# Patient Record
Sex: Male | Born: 1960 | Race: White | Hispanic: No | Smoking: Never smoker
Health system: Southern US, Community
[De-identification: ages and names within clinical notes are randomized; demographics above are authoritative.]

## PROBLEM LIST (undated history)

## (undated) DIAGNOSIS — K219 Gastro-esophageal reflux disease without esophagitis: Secondary | ICD-10-CM

## (undated) DIAGNOSIS — N400 Enlarged prostate without lower urinary tract symptoms: Secondary | ICD-10-CM

## (undated) DIAGNOSIS — R3915 Urgency of urination: Secondary | ICD-10-CM

## (undated) DIAGNOSIS — R0683 Snoring: Secondary | ICD-10-CM

## (undated) DIAGNOSIS — F8081 Childhood onset fluency disorder: Secondary | ICD-10-CM

## (undated) DIAGNOSIS — F419 Anxiety disorder, unspecified: Secondary | ICD-10-CM

## (undated) HISTORY — DX: Urgency of urination: R39.15

## (undated) HISTORY — DX: Snoring: R06.83

## (undated) HISTORY — DX: Childhood onset fluency disorder: F80.81

## (undated) HISTORY — DX: Anxiety disorder, unspecified: F41.9

## (undated) HISTORY — DX: Benign prostatic hyperplasia without lower urinary tract symptoms: N40.0

---

## 1998-04-26 ENCOUNTER — Inpatient Hospital Stay (HOSPITAL_COMMUNITY): Admission: EM | Admit: 1998-04-26 | Discharge: 1998-04-29 | Payer: Self-pay | Admitting: Emergency Medicine

## 2005-06-25 ENCOUNTER — Ambulatory Visit (HOSPITAL_BASED_OUTPATIENT_CLINIC_OR_DEPARTMENT_OTHER): Admission: RE | Admit: 2005-06-25 | Discharge: 2005-06-25 | Payer: Self-pay | Admitting: Otolaryngology

## 2005-07-11 ENCOUNTER — Ambulatory Visit: Payer: Self-pay | Admitting: Internal Medicine

## 2005-09-24 ENCOUNTER — Ambulatory Visit (HOSPITAL_BASED_OUTPATIENT_CLINIC_OR_DEPARTMENT_OTHER): Admission: RE | Admit: 2005-09-24 | Discharge: 2005-09-24 | Payer: Self-pay | Admitting: Otolaryngology

## 2008-09-13 HISTORY — PX: NASAL SEPTUM SURGERY: SHX37

## 2011-07-14 ENCOUNTER — Encounter: Payer: Self-pay | Admitting: Internal Medicine

## 2011-07-14 ENCOUNTER — Ambulatory Visit (INDEPENDENT_AMBULATORY_CARE_PROVIDER_SITE_OTHER): Payer: 59 | Admitting: Internal Medicine

## 2011-07-14 VITALS — BP 116/80 | HR 75 | Temp 98.2°F | Ht 71.0 in | Wt 190.0 lb

## 2011-07-14 DIAGNOSIS — R0609 Other forms of dyspnea: Secondary | ICD-10-CM

## 2011-07-14 DIAGNOSIS — F411 Generalized anxiety disorder: Secondary | ICD-10-CM

## 2011-07-14 DIAGNOSIS — G43909 Migraine, unspecified, not intractable, without status migrainosus: Secondary | ICD-10-CM | POA: Insufficient documentation

## 2011-07-14 DIAGNOSIS — F419 Anxiety disorder, unspecified: Secondary | ICD-10-CM

## 2011-07-14 DIAGNOSIS — N400 Enlarged prostate without lower urinary tract symptoms: Secondary | ICD-10-CM

## 2011-07-14 DIAGNOSIS — R0683 Snoring: Secondary | ICD-10-CM | POA: Insufficient documentation

## 2011-07-14 LAB — POCT URINALYSIS DIPSTICK
Ketones, UA: NEGATIVE
Protein, UA: NEGATIVE

## 2011-07-14 LAB — PSA: PSA: 1.81 ng/mL (ref 0.10–4.00)

## 2011-07-14 MED ORDER — SERTRALINE HCL 50 MG PO TABS: 75.0000 mg | ORAL_TABLET | Freq: Every day | ORAL | Status: DC

## 2011-07-14 NOTE — Assessment & Plan Note (Addendum)
s/p 3 sleep study: all neg , last 2011 Nasal surgery did not help Has tried a variety of nasal sprays w/o much help Will let me know if interested in another opinion

## 2011-07-14 NOTE — Patient Instructions (Signed)
Get records  from previous doctor

## 2011-07-14 NOTE — Assessment & Plan Note (Signed)
Sx not as well controlled as he desires, has not try other meds or higher dose of sertraline Plan: increase frm 50 to 75 mg (asked for the "lowest next dose")

## 2011-07-14 NOTE — Assessment & Plan Note (Addendum)
Sx suggest BPH although he has never been dx w/ that condition Plan: PSA UA w/ some evidence of infex, UCX pending  Will wait for result, consider Abx and/or flomax

## 2011-07-14 NOTE — Progress Notes (Signed)
  Subjective:    Patient ID: Daniel Petersen, male    DOB: [birth year], 50 y.o.   MRN: [id number]  HPI New patient, previous PCP Dr Moshe Salisbury Snoring: had nasal surgery for correction of snoring, still snores  Anxiety: on zoloft, sx not as well controlled as he desires Also 1 year h/o nocturia (x 2 at night), urinary frequency; no h/o prostate problems   Past Medical History  Diagnosis Date  . Migraine     f/u by neuro  . Snoring     s/p 3 sleep study: all neg , last 2011  . Anxiety     f/u by PCP   [phone] (home) [phone] (work) History   Social History  . Marital Status: Legally Separated    Spouse Name: N/A    Number of Children: 2  . Years of Education: N/A   Occupational History  . Curator    Social History Main Topics  . Smoking status: Never Smoker   . Smokeless tobacco: Never Used  . Alcohol Use: No  . Drug Use: No  . Sexually Active: Not on file   Other Topics Concern  . Not on file   Social History Narrative   Lives with wife    Family History  Problem Relation Age of Onset  . Stroke Mother     M late 6s  . Diabetes Maternal Grandmother   . Colon cancer Neg Hx   . Prostate cancer Neg Hx      Review of Systems No F-C No N-V No gross hematuria, no dysuria, no penile d/c  No depression per se     Objective:   Physical Exam  Constitutional: He is oriented to person, place, and time. He appears well-developed and well-nourished. No distress.  HENT:  Head: Normocephalic and atraumatic.       slt congested but on inspection turbinates seem normal  Cardiovascular: Normal rate, regular rhythm and normal heart sounds.   No murmur heard. Pulmonary/Chest: Effort normal and breath sounds normal. No respiratory distress. He has no wheezes. He has no rales.  Abdominal: Soft. Bowel sounds are normal. He exhibits no distension. There is no tenderness. There is no rebound and no guarding.  Genitourinary:       Anus: no external lesions Rectum  normal Prostate slt enlarge, no nodular or tender   Musculoskeletal: He exhibits no edema.  Neurological: He is alert and oriented to person, place, and time.  Skin: He is not diaphoretic.          Assessment & Plan:

## 2011-07-21 ENCOUNTER — Telehealth: Payer: Self-pay

## 2011-07-21 MED ORDER — TAMSULOSIN HCL 0.4 MG PO CAPS: 0.4000 mg | ORAL_CAPSULE | ORAL | Status: DC

## 2011-07-21 NOTE — Telephone Encounter (Signed)
Pt aware of results and requested that Rx be sent to [facility] [address].  Rx sent

## 2011-07-21 NOTE — Telephone Encounter (Signed)
Message copied by Beverely Low on Wed Jul 21, 2011 11:02 AM ------      Message from: Daniel Petersen      Created: Sun Jul 18, 2011 10:41 AM       Advise patient:      Labs normal      I recommend: flomax 0.4 mg 1 tab at night until he cames back, call #[phone], 6 Rf

## 2011-11-11 ENCOUNTER — Encounter: Payer: Self-pay | Admitting: Internal Medicine

## 2011-11-11 ENCOUNTER — Ambulatory Visit (INDEPENDENT_AMBULATORY_CARE_PROVIDER_SITE_OTHER): Payer: 59 | Admitting: Internal Medicine

## 2011-11-11 VITALS — BP 110/72 | HR 84 | Temp 98.0°F | Wt 189.0 lb

## 2011-11-11 DIAGNOSIS — F419 Anxiety disorder, unspecified: Secondary | ICD-10-CM

## 2011-11-11 DIAGNOSIS — F411 Generalized anxiety disorder: Secondary | ICD-10-CM

## 2011-11-11 DIAGNOSIS — N400 Enlarged prostate without lower urinary tract symptoms: Secondary | ICD-10-CM

## 2011-11-11 DIAGNOSIS — G43909 Migraine, unspecified, not intractable, without status migrainosus: Secondary | ICD-10-CM

## 2011-11-11 LAB — POCT URINALYSIS DIPSTICK
Bilirubin, UA: NEGATIVE
Glucose, UA: NEGATIVE
Ketones, UA: NEGATIVE
Leukocytes, UA: NEGATIVE
Nitrite, UA: NEGATIVE
pH, UA: 5

## 2011-11-11 NOTE — Assessment & Plan Note (Signed)
PSA was wnl, better on tamsulosin

## 2011-11-11 NOTE — Assessment & Plan Note (Signed)
Better since SSRIs increased, no change

## 2011-11-11 NOTE — Assessment & Plan Note (Signed)
Well controlled with current medications.

## 2011-11-11 NOTE — Progress Notes (Signed)
  Subjective:    Patient ID: Daniel Petersen, male    DOB: May 30, 1961, 51 y.o.   MRN: [id number]  HPI Routine office visit Anxiety, we recently increased his SSRI, doing much better, no apparent side effects. BPH, complained of nocturia 2 months ago, urine culture negative, PSA normal, started Flomax---> nocturia has decreased from 2-3 a night to at most one every night. Request a DOT  Exam  Past medical history Migraines, followup by neurology Snoring, status post 3 sleep studies, all negative, last 2000 and Anxiety, followed by PCP BPH, diagnosed around 06-2011  Past surgical history Nasal septum surgery 2000 and   Review of Systems No dysuria, gross hematuria. No difficulty urinating. Migraines well controlled.     Objective:   Physical Exam  Constitutional: He is oriented to person, place, and time. He appears well-developed and well-nourished. No distress.  HENT:  Head: Normocephalic and atraumatic.  Right Ear: External ear normal.  Left Ear: External ear normal.  Neck: Normal range of motion.  Cardiovascular: Normal rate, regular rhythm and normal heart sounds.   No murmur heard. Pulmonary/Chest: Effort normal and breath sounds normal. No respiratory distress. He has no wheezes. He has no rales.  Abdominal: Soft. Bowel sounds are normal. There is no tenderness. There is no rebound and no guarding.       No inguinal hernias  Musculoskeletal:       Gait normal  Neurological: He is alert and oriented to person, place, and time.       Motor and DTRs symmetric, normal  Skin: Skin is warm and dry. He is not diaphoretic.  Psychiatric: He has a normal mood and affect. His behavior is normal. Judgment and thought content normal.          Assessment & Plan:  DOT exam completed, forms filled  Today , In addition of his regular visit I spent additional time feeling forms. F2F > 25 min

## 2011-12-25 ENCOUNTER — Encounter (HOSPITAL_BASED_OUTPATIENT_CLINIC_OR_DEPARTMENT_OTHER): Payer: Self-pay | Admitting: *Deleted

## 2011-12-25 ENCOUNTER — Emergency Department (HOSPITAL_BASED_OUTPATIENT_CLINIC_OR_DEPARTMENT_OTHER)
Admission: EM | Admit: 2011-12-25 | Discharge: 2011-12-25 | Disposition: A | Discharge status: Home / Self Care | Payer: 59 | Attending: Emergency Medicine | Admitting: Emergency Medicine

## 2011-12-25 DIAGNOSIS — T25222A Burn of second degree of left foot, initial encounter: Secondary | ICD-10-CM

## 2011-12-25 DIAGNOSIS — F411 Generalized anxiety disorder: Secondary | ICD-10-CM | POA: Insufficient documentation

## 2011-12-25 DIAGNOSIS — Z79899 Other long term (current) drug therapy: Secondary | ICD-10-CM | POA: Insufficient documentation

## 2011-12-25 DIAGNOSIS — T25229A Burn of second degree of unspecified foot, initial encounter: Secondary | ICD-10-CM | POA: Insufficient documentation

## 2011-12-25 DIAGNOSIS — W3189XA Contact with other specified machinery, initial encounter: Secondary | ICD-10-CM | POA: Insufficient documentation

## 2011-12-25 MED ORDER — HYDROCODONE-ACETAMINOPHEN 5-325 MG PO TABS: 2.0000 | ORAL_TABLET | ORAL | Status: AC | PRN

## 2011-12-25 NOTE — ED Notes (Signed)
Pt states he was using a pressure washer about a week ago and hot water got down into his boot and burned his left foot. Has been using neosporin, but wound is not healing.

## 2011-12-25 NOTE — Discharge Instructions (Signed)
Continue your present treatment with dressings and Neosporin.  Return if no improvement or development of fever or red streaks.   Burn Care Your skin is a natural barrier to infection. It is the largest organ of your body. Burns damage this natural protection. To help prevent infection, it is very important to follow your caregiver's instructions in the care of your burn. Burns are classified as:  First degree. There is only redness of the skin (erythema). No scarring is expected.   Second degree. There is blistering of the skin. Scarring may occur with deeper burns.   Third degree. All layers of the skin are injured, and scarring is expected.  HOME CARE INSTRUCTIONS   Wash your hands well before changing your bandage.   Change your bandage as often as directed by your caregiver.   Remove the old bandage. If the bandage sticks, you may soak it off with cool, clean water.   Cleanse the burn thoroughly but gently with mild soap and water.   Pat the area dry with a clean, dry cloth.   Apply a thin layer of antibacterial cream to the burn.   Apply a clean bandage as instructed by your caregiver.   Keep the bandage as clean and dry as possible.   Elevate the affected area for the first 24 hours, then as instructed by your caregiver.   Only take over-the-counter or prescription medicines for pain, discomfort, or fever as directed by your caregiver.  SEEK IMMEDIATE MEDICAL CARE IF:   You develop excessive pain.   You develop redness, tenderness, swelling, or red streaks near the burn.   The burned area develops yellowish-white fluid (pus) or a bad smell.   You have a fever.  MAKE SURE YOU:   Understand these instructions.   Will watch your condition.   Will get help right away if you are not doing well or get worse.  Document Released: 08/30/2005 Document Revised: 08/19/2011 Document Reviewed: 01/20/2011 [facility] Patient Information 2012 [place], [place].

## 2011-12-25 NOTE — ED Provider Notes (Signed)
History   This chart was scribed for Nelia Shi, MD by Charolett Bumpers . The patient was seen in room MH07/MH07 and the patient's care was started at 3:21pm.    CSN: [id number]  Arrival date & time 12/25/11  1439   First MD Initiated Contact with Patient 12/25/11 1521      Chief Complaint  Patient presents with  . Foot Burn    (Consider location/radiation/quality/duration/timing/severity/associated sxs/prior treatment) HPI Daniel Petersen is a 51 y.o. male who presents to the Emergency Department complaining of a constant, moderate 2nd degree burn to the top of his left foot. Patient states that about a week ago, he was using a pressure washer when the hot water went down his boot and burned his left foot. Patient states that for the past week, he has been using neosporin on the burn as well as keeping it wrapped with gauze. Patient states that he has been wearing a shoe on the injured foot, which has provided some irritation. No other symptoms reported. No pertinent medical problems noted.     Past Medical History  Diagnosis Date  . Migraine     f/u by neuro  . Snoring     s/p 3 sleep study: all neg , last 2011  . Anxiety     f/u by PCP    Past Surgical History  Procedure Date  . Nasal septum surgery 2010    Family History  Problem Relation Age of Onset  . Stroke Mother     M late 18s  . Diabetes Maternal Grandmother   . Colon cancer Neg Hx   . Prostate cancer Neg Hx     History  Substance Use Topics  . Smoking status: Never Smoker   . Smokeless tobacco: Never Used  . Alcohol Use: No      Review of Systems A complete 10 system review of systems was obtained and all systems are negative except as noted in the HPI and PMH.   Allergies  Review of patient's allergies indicates no known allergies.  Home Medications   Current Outpatient Rx  Name Route Sig Dispense Refill  . ATENOLOL 50 MG PO TABS Oral Take 50 mg by mouth at bedtime.    Marland Kitchen  DICLOFENAC POTASSIUM 50 MG PO PACK Oral Take 50 mg by mouth as needed.      . SERTRALINE HCL 50 MG PO TABS Oral Take 1.5 tablets (75 mg total) by mouth daily. 60 tablet 2  . TAMSULOSIN HCL 0.4 MG PO CAPS Oral Take 1 capsule (0.4 mg total) by mouth daily after supper. 30 capsule 6  . ZOLMITRIPTAN 5 MG PO TABS Oral Take 5 mg by mouth as needed.        BP 95/66  Pulse 65  Temp(Src) 97.5 F (36.4 C) (Oral)  Resp 18  Ht 5\' 11"  (1.803 m)  Wt 189 lb (85.73 kg)  BMI 26.36 kg/m2  SpO2 100%  Physical Exam  Nursing note and vitals reviewed. Constitutional: He is oriented to person, place, and time. He appears well-developed and well-nourished. No distress.  HENT:  Head: Normocephalic and atraumatic.  Right Ear: External ear normal.  Left Ear: External ear normal.  Nose: Nose normal.  Eyes: Conjunctivae and EOM are normal. Pupils are equal, round, and reactive to light.  Neck: Normal range of motion. Neck supple. No tracheal deviation present.  Cardiovascular: Normal rate.   Pulmonary/Chest: Effort normal. No respiratory distress.  Abdominal: Soft. He exhibits no distension.  Musculoskeletal: Normal range of motion. He exhibits no edema.       Feet:  Neurological: He is alert and oriented to person, place, and time. No sensory deficit.  Skin: Skin is warm and dry.       3x4 cm 2nd degree burn on dorsal aspect of left foot. 2nd degree non-intact blister noted. Pink, granule skin underneath blister. No signs of cellulitis.   Psychiatric: He has a normal mood and affect. His behavior is normal.    ED Course  Procedures (including critical care time)  DIAGNOSTIC STUDIES: Oxygen Saturation is 100% on room air, normal by my interpretation.    COORDINATION OF CARE:   1524: Discussed planned course of care with the patient who is agreeable at this time. Will prescribe medication for pain. Discussed continuing the neosporin and returning if no improvement.   Labs Reviewed - No data to  display No results found.   No diagnosis found.    MDM    I personally performed the services described in this documentation, which was scribed in my presence. The recorded information has been reviewed and considered.         Nelia Shi, MD 12/25/11 1640

## 2012-01-29 ENCOUNTER — Other Ambulatory Visit: Payer: Self-pay | Admitting: Internal Medicine

## 2012-01-31 NOTE — Telephone Encounter (Signed)
Refill done.  

## 2012-03-08 ENCOUNTER — Encounter: Payer: Self-pay | Admitting: Family Medicine

## 2012-03-08 ENCOUNTER — Ambulatory Visit (INDEPENDENT_AMBULATORY_CARE_PROVIDER_SITE_OTHER): Payer: 59 | Admitting: Family Medicine

## 2012-03-08 ENCOUNTER — Telehealth: Payer: Self-pay

## 2012-03-08 VITALS — BP 115/74 | HR 70 | Temp 98.3°F | Resp 20 | Ht 70.5 in | Wt 189.2 lb

## 2012-03-08 DIAGNOSIS — R52 Pain, unspecified: Secondary | ICD-10-CM

## 2012-03-08 DIAGNOSIS — R1013 Epigastric pain: Secondary | ICD-10-CM

## 2012-03-08 LAB — POCT CBC
Granulocyte percent: 68.5 %G (ref 37–80)
HCT, POC: 42.3 % — AB (ref 43.5–53.7)
Hemoglobin: 13.9 g/dL — AB (ref 14.1–18.1)
Lymph, poc: 2.7 (ref 0.6–3.4)
MCH, POC: 29.8 pg (ref 27–31.2)
MCHC: 32.9 g/dL (ref 31.8–35.4)
MCV: 90.6 fL (ref 80–97)
MID (cbc): 0.7 (ref 0–0.9)
MPV: 8.4 fL (ref 0–99.8)
POC Granulocyte: 7.3 — AB (ref 2–6.9)
POC LYMPH PERCENT: 25 %L (ref 10–50)
POC MID %: 6.5 %M (ref 0–12)
Platelet Count, POC: 263 10*3/uL (ref 142–424)
RBC: 4.67 M/uL — AB (ref 4.69–6.13)
RDW, POC: 13.9 %
WBC: 10.6 10*3/uL — AB (ref 4.6–10.2)

## 2012-03-08 MED ORDER — ESOMEPRAZOLE MAGNESIUM 40 MG PO CPDR: 40.0000 mg | DELAYED_RELEASE_CAPSULE | Freq: Every day | ORAL | Status: DC

## 2012-03-08 MED ORDER — OMEPRAZOLE 40 MG PO CPDR: 40.0000 mg | DELAYED_RELEASE_CAPSULE | Freq: Every day | ORAL | Status: DC

## 2012-03-08 NOTE — Telephone Encounter (Signed)
[facility] called and stated that the Nexium Rxd by Dr L today is not covered by pt's ins and wondered if we can change him to something cheaper. Pharmacist checked w/pt who reported he has not tried omeprazole before. Dr L, OK'd change to omeprazole 40 QD. Called pharmacy and gave change to pharmacist.

## 2012-03-08 NOTE — Progress Notes (Signed)
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  No prior hx.  It appeared to be disappearing, but returned this am.   Tried cocacola and OTC "acid reducers" which haven't helped. No vomiting, diarrhea, or fever.  No urinary symptoms.  O:  NAD Chest:  CTA Heart: reg, ;no murmur Abdomen:  Soft, no HSM or masses, tender epigastrium without rebound or guarding Ext:  No abn Skin:  No rash  Results for orders placed in visit on 03/08/12  POCT CBC      Component Value Range   WBC 10.6 (*) 4.6 - 10.2 K/uL   Lymph, poc 2.7  0.6 - 3.4   POC LYMPH PERCENT 25.0  10 - 50 %L   MID (cbc) 0.7  0 - 0.9   POC MID % 6.5  0 - 12 %M   POC Granulocyte 7.3 (*) 2 - 6.9   Granulocyte percent 68.5  37 - 80 %G   RBC 4.67 (*) 4.69 - 6.13 M/uL   Hemoglobin 13.9 (*) 14.1 - 18.1 g/dL   HCT, POC 11.9 (*) 14.7 - 53.7 %   MCV 90.6  80 - 97 fL   MCH, POC 29.8  27 - 31.2 pg   MCHC 32.9  31.8 - 35.4 g/dL   RDW, POC 82.9     Platelet Count, POC 263  142 - 424 K/uL   MPV 8.4  0 - 99.8 fL   Assessment: Acute gastritis is the most likely cause.  Plan: Check H. pylori, start Nexium 3 mg

## 2012-03-09 NOTE — Progress Notes (Signed)
Completed prior auth for pt's omeprazole 40 and received approval for 36 mos. Faxed notice back to pharmacy.

## 2012-03-10 ENCOUNTER — Encounter: Payer: 59 | Admitting: Internal Medicine

## 2012-03-10 LAB — HELICOBACTER PYLORI  ANTIBODY, IGM: Helicobacter pylori, IgM: 4.6 U/mL (ref <9.0)

## 2012-05-16 ENCOUNTER — Encounter: Payer: 59 | Admitting: Internal Medicine

## 2012-07-17 ENCOUNTER — Ambulatory Visit (INDEPENDENT_AMBULATORY_CARE_PROVIDER_SITE_OTHER): Payer: 59 | Admitting: Internal Medicine

## 2012-07-17 ENCOUNTER — Encounter: Payer: Self-pay | Admitting: Internal Medicine

## 2012-07-17 VITALS — BP 130/86 | HR 75 | Temp 98.3°F | Ht 71.25 in | Wt 190.0 lb

## 2012-07-17 DIAGNOSIS — R1013 Epigastric pain: Secondary | ICD-10-CM

## 2012-07-17 DIAGNOSIS — R52 Pain, unspecified: Secondary | ICD-10-CM

## 2012-07-17 DIAGNOSIS — Z0000 Encounter for general adult medical examination without abnormal findings: Secondary | ICD-10-CM

## 2012-07-17 DIAGNOSIS — G43909 Migraine, unspecified, not intractable, without status migrainosus: Secondary | ICD-10-CM

## 2012-07-17 LAB — COMPREHENSIVE METABOLIC PANEL
ALT: 32 U/L (ref 0–53)
CO2: 27 mEq/L (ref 19–32)
Calcium: 9 mg/dL (ref 8.4–10.5)
Chloride: 103 mEq/L (ref 96–112)
Creatinine, Ser: 0.9 mg/dL (ref 0.4–1.5)
GFR: 93.4 mL/min (ref >60.00)
Glucose, Bld: 82 mg/dL (ref 70–99)
Sodium: 137 mEq/L (ref 135–145)
Total Bilirubin: 0.4 mg/dL (ref 0.3–1.2)
Total Protein: 7 g/dL (ref 6.0–8.3)

## 2012-07-17 LAB — CBC WITH DIFFERENTIAL/PLATELET
Basophils Absolute: 0.1 10*3/uL (ref 0.0–0.1)
Eosinophils Relative: 2.3 % (ref 0.0–5.0)
Lymphocytes Relative: 30.4 % (ref 12.0–46.0)
Monocytes Relative: 6.4 % (ref 3.0–12.0)
Neutrophils Relative %: 60.2 % (ref 43.0–77.0)
Platelets: 256 10*3/uL (ref 150.0–400.0)
RDW: 13.4 % (ref 11.5–14.6)
WBC: 7.4 10*3/uL (ref 4.5–10.5)

## 2012-07-17 LAB — PSA: PSA: 2.64 ng/mL (ref 0.10–4.00)

## 2012-07-17 LAB — LIPID PANEL
Cholesterol: 154 mg/dL (ref 0–200)
HDL: 44.6 mg/dL (ref >39.00)
LDL Cholesterol: 79 mg/dL (ref 0–99)
Triglycerides: 151 mg/dL — ABNORMAL HIGH (ref 0.0–149.0)
VLDL: 30.2 mg/dL (ref 0.0–40.0)

## 2012-07-17 MED ORDER — OMEPRAZOLE 40 MG PO CPDR: 40.0000 mg | DELAYED_RELEASE_CAPSULE | Freq: Every day | ORAL | Status: DC

## 2012-07-17 MED ORDER — SERTRALINE HCL 50 MG PO TABS: 75.0000 mg | ORAL_TABLET | Freq: Every day | ORAL | Status: DC

## 2012-07-17 NOTE — Progress Notes (Signed)
  Subjective:    Patient ID: Daniel Petersen, male    DOB: 1961-04-16, 51 y.o.   MRN: [id number]  HPI CPX  Past Medical History  Diagnosis Date  . Migraine     f/u by neuro  . Snoring     s/p 3 sleep study: all neg , last 2011  . Anxiety   . BPH (benign prostatic hyperplasia)    Past Surgical History  Procedure Date  . Nasal septum surgery 2010   History   Social History  . Marital Status: Married    Spouse Name: N/A    Number of Children: 2  . Years of Education: N/A   Occupational History  . Curator    Social History Main Topics  . Smoking status: Never Smoker   . Smokeless tobacco: Never Used  . Alcohol Use: No  . Drug Use: No  . Sexually Active: Yes -- Male partner(s)   Other Topics Concern  . Not on file   Social History Narrative   Lives with wife ---- diet: average ----- exercise: active at work, no routine exercise    Family History  Problem Relation Age of Onset  . Stroke Mother     M late 46s  . Diabetes Maternal Grandmother   . Colon cancer Neg Hx   . Prostate cancer Neg Hx   . CAD Neg Hx     Review of Systems No chest pain or shortness or breath No nausea, vomiting, diarrhea or blood in the stools History of BPH, not taking Flomax, doing well at this point. No blood in the urine. Occasionally reports sinus congestion in the morning, no sneezing, itchy eyes or itchy nose. Emotionally doing well with current medications. Headaches currently well controlled.    Objective:   Physical Exam  General -- alert, well-developed    Neck --no thyromegaly , normal carotid pulse  Lungs -- normal respiratory effort, no intercostal retractions, no accessory muscle use, and normal breath sounds.   Heart-- normal rate, regular rhythm, no murmur, and no gallop.   Abdomen--soft, non-tender, no distention, no masses, no HSM, no guarding, and no rigidity.   Extremities-- no pretibial edema bilaterally Rectal-- No external abnormalities noted. Normal  sphincter tone. No rectal masses or tenderness. Brown stool, Hemoccult negative Prostate:  Prostate gland firm and smooth, no enlargement, nodularity, tenderness, mass, asymmetry or induration. Neurologic-- alert & oriented X3 and strength normal in all extremities. Psych-- Cognition and judgment appear intact. Alert and cooperative with normal attention span and concentration.  not anxious appearing and not depressed appearing.       Assessment & Plan:

## 2012-07-17 NOTE — Assessment & Plan Note (Signed)
[facility], beta blockers did not help, currently doing well

## 2012-07-17 NOTE — Assessment & Plan Note (Signed)
Td 09 per pt Declined a flu shot Never had a colonoscopy, pros-cons of a colonoscopy discussed, provided an iFOB. Will call if interested home pursue a colonoscopy Diet-exercise discussed Labs

## 2012-07-19 ENCOUNTER — Encounter: Payer: Self-pay | Admitting: *Deleted

## 2012-08-22 ENCOUNTER — Other Ambulatory Visit (INDEPENDENT_AMBULATORY_CARE_PROVIDER_SITE_OTHER): Payer: 59

## 2012-08-22 ENCOUNTER — Encounter: Payer: Self-pay | Admitting: *Deleted

## 2012-08-22 DIAGNOSIS — Z0000 Encounter for general adult medical examination without abnormal findings: Secondary | ICD-10-CM

## 2013-07-17 ENCOUNTER — Telehealth: Payer: Self-pay

## 2013-07-17 NOTE — Telephone Encounter (Signed)
Medication List and allergies: reviewed and updated  90 day supply/mail order: na [facility] [address]  Immunizations due: declines flu vaccine  A/P:   No changes to FH or PSH tdap 2009 per patient PSA--06/2011---WNL No CCS--FOB--08/2012--neg  To Discuss with Provider: Not at this time

## 2013-07-19 ENCOUNTER — Encounter: Payer: Self-pay | Admitting: Internal Medicine

## 2013-07-19 ENCOUNTER — Ambulatory Visit (INDEPENDENT_AMBULATORY_CARE_PROVIDER_SITE_OTHER): Payer: 59 | Admitting: Internal Medicine

## 2013-07-19 VITALS — BP 110/77 | HR 72 | Temp 98.4°F

## 2013-07-19 DIAGNOSIS — Z0000 Encounter for general adult medical examination without abnormal findings: Secondary | ICD-10-CM

## 2013-07-19 DIAGNOSIS — F419 Anxiety disorder, unspecified: Secondary | ICD-10-CM

## 2013-07-19 DIAGNOSIS — R350 Frequency of micturition: Secondary | ICD-10-CM

## 2013-07-19 DIAGNOSIS — N400 Enlarged prostate without lower urinary tract symptoms: Secondary | ICD-10-CM

## 2013-07-19 LAB — CBC WITH DIFFERENTIAL/PLATELET
Basophils Absolute: 0 10*3/uL (ref 0.0–0.1)
Basophils Relative: 0.6 % (ref 0.0–3.0)
Eosinophils Absolute: 0.2 10*3/uL (ref 0.0–0.7)
Lymphocytes Relative: 30.6 % (ref 12.0–46.0)
MCHC: 34.1 g/dL (ref 30.0–36.0)
Monocytes Relative: 7.4 % (ref 3.0–12.0)
Neutrophils Relative %: 57.9 % (ref 43.0–77.0)
RBC: 4.79 Mil/uL (ref 4.22–5.81)

## 2013-07-19 LAB — COMPREHENSIVE METABOLIC PANEL
AST: 23 U/L (ref 0–37)
Albumin: 3.6 g/dL (ref 3.5–5.2)
BUN: 18 mg/dL (ref 6–23)
CO2: 31 mEq/L (ref 19–32)
Calcium: 8.9 mg/dL (ref 8.4–10.5)
Chloride: 103 mEq/L (ref 96–112)
GFR: 85.41 mL/min (ref >60.00)
Glucose, Bld: 80 mg/dL (ref 70–99)
Potassium: 4.5 mEq/L (ref 3.5–5.1)

## 2013-07-19 LAB — LIPID PANEL
Cholesterol: 161 mg/dL (ref 0–200)
LDL Cholesterol: 93 mg/dL (ref 0–99)
Triglycerides: 100 mg/dL (ref 0.0–149.0)

## 2013-07-19 LAB — POCT URINALYSIS DIPSTICK
Glucose, UA: NEGATIVE
Ketones, UA: NEGATIVE
Spec Grav, UA: 1.02

## 2013-07-19 MED ORDER — TAMSULOSIN HCL 0.4 MG PO CAPS: 0.4000 mg | ORAL_CAPSULE | Freq: Every day | ORAL | Status: DC

## 2013-07-19 NOTE — Assessment & Plan Note (Signed)
Td 09 per pt Declined a flu shot Never had a colonoscopy, pros-cons of a colonoscopy discussed again--- refer to GI Diet-exercise discussed Labs

## 2013-07-19 NOTE — Patient Instructions (Signed)
Get your blood work before you leave  Next visit in 6 months for a   follow up . No Fasting Please make an appointment    

## 2013-07-19 NOTE — Progress Notes (Signed)
  Subjective:    Patient ID: Daniel Petersen, male    DOB: [birth year], 52 y.o.   MRN: [id number]  HPI CPX Complaining of urinary frequency, slightly more noticeable in the last few months, denies dysuria, gross hematuria, flow is slightly slow. Most noticeable in the morning, needs to urinate 4 or 5 times before 9 AM.  Past Medical History  Diagnosis Date  . Migraine     f/u by neuro  . Snoring     s/p 3 sleep study: all neg , last 2011  . Anxiety   . BPH (benign prostatic hyperplasia)   . Stuttering    Past Surgical History  Procedure Laterality Date  . Nasal septum surgery  2010   History   Social History  . Marital Status: Married    Spouse Name: N/A    Number of Children: 2  . Years of Education: N/A   Occupational History  . Curator    Social History Main Topics  . Smoking status: Never Smoker   . Smokeless tobacco: Never Used  . Alcohol Use: No  . Drug Use: No  . Sexual Activity: Yes    Partners: Female   Other Topics Concern  . Not on file   Social History Narrative   Lives with wife     Family History  Problem Relation Age of Onset  . Stroke Mother     M late 50s  . Diabetes Maternal Grandmother   . Colon cancer Neg Hx   . Prostate cancer Neg Hx   . CAD Neg Hx     Review of Systems Diet-- average Exercise-- active at work No  CP, SOB, lower extremity edema  Denies  nausea, vomiting diarrhea Denies  blood in the stools No dysphagia or odynophagia No dysuria, gross hematuria, difficulty urinating   Anxiety well controlled, no depression      Objective:   Physical Exam BP 110/77  Pulse 72  Temp(Src) 98.4 F (36.9 C)  SpO2 95% General -- alert, well-developed, NAD.  Neck --no thyromegaly  Lungs -- normal respiratory effort, no intercostal retractions, no accessory muscle use, and normal breath sounds.  Heart-- normal rate, regular rhythm, no murmur.  Abdomen-- Not distended, good bowel sounds,soft, non-tender. Rectal-- No external  abnormalities noted. Normal sphincter tone. No rectal masses or tenderness. Brown stool, Hemoccult negative  Prostate--Prostate gland firm and smooth, no enlargement, nodularity, tenderness, mass, asymmetry or induration. Extremities-- no pretibial edema bilaterally  Neurologic--  alert & oriented X3. Speech normal, gait normal, strength normal in all extremities.  Psych-- Cognition and judgment appear intact. Cooperative with normal attention span and concentration. No anxious appearing , no depressed appearing.     Assessment & Plan:

## 2013-07-19 NOTE — Assessment & Plan Note (Signed)
Currently stable.

## 2013-07-19 NOTE — Assessment & Plan Note (Addendum)
Continue with LUTS, digital exam normal, Udip neg, recent PSAs wnl . In the past flomax helped but he discontinued it because he felt better. Plan: Restart Flomax, reassess in 6 months

## 2013-07-24 ENCOUNTER — Encounter: Payer: Self-pay | Admitting: *Deleted

## 2013-08-23 ENCOUNTER — Encounter: Payer: Self-pay | Admitting: Internal Medicine

## 2013-09-20 ENCOUNTER — Telehealth: Payer: Self-pay | Admitting: *Deleted

## 2013-09-20 MED ORDER — OMEPRAZOLE 40 MG PO CPDR: 40.0000 mg | DELAYED_RELEASE_CAPSULE | Freq: Every day | ORAL | Status: DC

## 2013-09-20 NOTE — Telephone Encounter (Signed)
Patient called and requested a refill for omeprazole (PRILOSEC) 40 MG capsule   Pharmacy  [id number] - [place], [place] - [address]

## 2013-09-20 NOTE — Telephone Encounter (Signed)
rx sent  Per protocol. DJR

## 2013-10-02 ENCOUNTER — Encounter: Payer: Self-pay | Admitting: Internal Medicine

## 2013-10-25 ENCOUNTER — Encounter: Payer: 59 | Admitting: Internal Medicine

## 2013-11-16 ENCOUNTER — Telehealth: Payer: Self-pay | Admitting: Internal Medicine

## 2013-11-16 MED ORDER — SERTRALINE HCL 50 MG PO TABS: 75.0000 mg | ORAL_TABLET | Freq: Every day | ORAL | Status: DC

## 2013-11-16 NOTE — Telephone Encounter (Signed)
rx sent

## 2013-11-16 NOTE — Telephone Encounter (Signed)
Patient wife called and requested a refill for Zoloft and patient had tried calling the office several times and no response. Please advise   Ph[facility]acy [facility] [address]

## 2014-01-16 ENCOUNTER — Ambulatory Visit: Payer: 59 | Admitting: Internal Medicine

## 2014-02-27 ENCOUNTER — Other Ambulatory Visit: Payer: Self-pay | Admitting: Internal Medicine

## 2014-05-11 ENCOUNTER — Ambulatory Visit (INDEPENDENT_AMBULATORY_CARE_PROVIDER_SITE_OTHER): Payer: 59

## 2014-05-11 ENCOUNTER — Ambulatory Visit (INDEPENDENT_AMBULATORY_CARE_PROVIDER_SITE_OTHER): Payer: 59 | Admitting: Internal Medicine

## 2014-05-11 VITALS — BP 132/70 | HR 81 | Temp 98.0°F | Resp 15 | Ht 71.5 in | Wt 196.0 lb

## 2014-05-11 DIAGNOSIS — S66911A Strain of unspecified muscle, fascia and tendon at wrist and hand level, right hand, initial encounter: Secondary | ICD-10-CM

## 2014-05-11 DIAGNOSIS — M79609 Pain in unspecified limb: Secondary | ICD-10-CM

## 2014-05-11 DIAGNOSIS — M79644 Pain in right finger(s): Secondary | ICD-10-CM

## 2014-05-11 DIAGNOSIS — M65839 Other synovitis and tenosynovitis, unspecified forearm: Secondary | ICD-10-CM

## 2014-05-11 DIAGNOSIS — M79641 Pain in right hand: Secondary | ICD-10-CM

## 2014-05-11 DIAGNOSIS — M778 Other enthesopathies, not elsewhere classified: Secondary | ICD-10-CM

## 2014-05-11 DIAGNOSIS — M779 Enthesopathy, unspecified: Secondary | ICD-10-CM

## 2014-05-11 DIAGNOSIS — M65849 Other synovitis and tenosynovitis, unspecified hand: Secondary | ICD-10-CM

## 2014-05-11 DIAGNOSIS — S6390XA Sprain of unspecified part of unspecified wrist and hand, initial encounter: Secondary | ICD-10-CM

## 2014-05-11 MED ORDER — IBUPROFEN 600 MG PO TABS: 600.0000 mg | ORAL_TABLET | Freq: Three times a day (TID) | ORAL | Status: DC | PRN

## 2014-05-11 NOTE — Patient Instructions (Signed)

## 2014-05-11 NOTE — Progress Notes (Signed)
   Subjective:    Patient ID: Daniel Petersen, male    DOB: 25-Jun-1961, 53 y.o.   MRN: [id number]  HPI  Patient being seen at [facility] for ring finger pain on right hand. Limited ROM with ring finger. Not sure when it started. Pain scale at 8 out 10. Patient states it feel the pain shooting up the arm. A little swollen. Have not taken any OTC. 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 Has no wounds, no redness, no fever. Painfull from base of 4th finger to elbow over flexor tendon. Review of Systems     Objective:   Physical Exam  Constitutional: He is oriented to person, place, and time. He appears well-developed and well-nourished. No distress.  HENT:  Head: Normocephalic.  Eyes: EOM are normal. Pupils are equal, round, and reactive to light.  Pulmonary/Chest: Effort normal.  Musculoskeletal:       Right hand: He exhibits decreased range of motion, tenderness, bony tenderness and swelling. He exhibits normal two-point discrimination, normal capillary refill, no deformity and no laceration. Normal sensation noted. Decreased strength noted. He exhibits no wrist extension trouble.       Hands: Pain to elbow over 4th digit tendon sheathe  Neurological: He is alert and oriented to person, place, and time. No cranial nerve deficit. He exhibits normal muscle tone. Coordination normal.  Skin: No rash noted. No erythema.  Psychiatric: He has a normal mood and affect. His behavior is normal. Judgment and thought content normal.   [facility] reading (PRIMARY) by  Dr.Blake Vetrano normal xr, no fb or fx seen         Assessment & Plan:  Tendonitis hand and wrist Hand/wrist strain/pain RICE/Splint/do not use hand  Motrin  tid

## 2014-05-15 ENCOUNTER — Other Ambulatory Visit: Payer: Self-pay | Admitting: Internal Medicine

## 2014-06-14 ENCOUNTER — Encounter: Payer: Self-pay | Admitting: Internal Medicine

## 2014-06-14 ENCOUNTER — Ambulatory Visit (INDEPENDENT_AMBULATORY_CARE_PROVIDER_SITE_OTHER): Payer: 59 | Admitting: Internal Medicine

## 2014-06-14 VITALS — BP 127/87 | HR 79 | Temp 98.5°F | Wt 202.4 lb

## 2014-06-14 DIAGNOSIS — K625 Hemorrhage of anus and rectum: Secondary | ICD-10-CM | POA: Insufficient documentation

## 2014-06-14 NOTE — Progress Notes (Signed)
Pre visit review using our clinic review tool, if applicable. No additional management support is needed unless otherwise documented below in the visit note. 

## 2014-06-14 NOTE — Patient Instructions (Signed)
Will refer you to the GI doctor for consideration of a colonoscopy, if you don't  hear from them within 10 days , let us know   If you have more episodes of blood in the stools please call the office

## 2014-06-14 NOTE — Assessment & Plan Note (Addendum)
53 year old gentleman who never had a colonoscopy he presents with 2 episodes of blood per rectum, blood was mixed with the stool. Anoscopy showed internal hemorrhoids . I think he needs further evaluation, will refer to GI. He agrees and states he will pursue further eval.  Pt aware the  DDX includes bleeding from hemorrhoids, colon polyps but also malignancy

## 2014-06-14 NOTE — Progress Notes (Signed)
   Subjective:    Patient ID: Daniel Petersen, male    DOB: July 06, 1961, 53 y.o.   MRN: [id number]  DOS:  06/14/2014 Type of visit - description : acute Interval history: Two episodes of  Red- dark blood per rectum last month. Describes the amount is "a lot", could not elaborate. When asked, admits to the blood was mixed with the stools.   ROS Denies fever, chills, weight loss No abdominal pain Mild itching/discomfort at the ano- rectal area   Past Medical History  Diagnosis Date  . Migraine     f/u by neuro  . Snoring     s/p 3 sleep study: all neg , last 2011  . Anxiety   . BPH (benign prostatic hyperplasia)   . Stuttering     Past Surgical History  Procedure Laterality Date  . Nasal septum surgery  2010    History   Social History  . Marital Status: Married    Spouse Name: N/A    Number of Children: 2  . Years of Education: N/A   Occupational History  . Curatormechanic    Social History Main Topics  . Smoking status: Never Smoker   . Smokeless tobacco: Never Used  . Alcohol Use: No  . Drug Use: No  . Sexual Activity: Yes    Partners: Female   Other Topics Concern  . Not on file   Social History Narrative   Lives with wife          Medication List       This list is accurate as of: 06/14/14 11:59 PM.  Always use your most recent med list.               CAMBIA PO  Take 1 tablet by mouth as needed.     omeprazole 40 MG capsule  Commonly known as:  PRILOSEC  TAKE 1 CAPSULE (40 MG TOTAL) BY MOUTH DAILY.     sertraline 50 MG tablet  Commonly known as:  ZOLOFT  TAKE 1.5 TABLETS (75 MG TOTAL) BY MOUTH DAILY.     zolmitriptan 5 MG tablet  Commonly known as:  ZOMIG  Take 5 mg by mouth as needed.           Objective:   Physical Exam BP 127/87  Pulse 79  Temp(Src) 98.5 F (36.9 C) (Oral)  Wt 202 lb 6 oz (91.797 kg)  SpO2 97%  General -- alert, well-developed, NAD.   HEENT-- Not pale.  Abdomen-- Not distended, good bowel sounds,soft,  non-tender. No rebound or rigidity. No mass,organomegaly. Rectal-- No external abnormalities noted. Normal sphincter tone. No rectal masses or tenderness. Stool brown, no blood or mucus Prostate--Prostate gland firm and smooth, no enlargement Anoscopy-- Several small internal hemorrhoids with no evidence of recent bleed. No polyp or mass Psych-- Cognition and judgment appear intact. Cooperative with normal attention span and concentration. No anxious or depressed appearing.       Assessment & Plan:    Declined flu shot

## 2014-06-19 ENCOUNTER — Encounter: Payer: Self-pay | Admitting: Gastroenterology

## 2014-08-06 ENCOUNTER — Other Ambulatory Visit: Payer: Self-pay | Admitting: Internal Medicine

## 2014-08-19 ENCOUNTER — Encounter: Payer: Self-pay | Admitting: Gastroenterology

## 2014-08-19 ENCOUNTER — Ambulatory Visit (INDEPENDENT_AMBULATORY_CARE_PROVIDER_SITE_OTHER): Payer: 59 | Admitting: Gastroenterology

## 2014-08-19 VITALS — BP 122/72 | HR 64 | Ht 69.88 in | Wt 197.5 lb

## 2014-08-19 DIAGNOSIS — Z1211 Encounter for screening for malignant neoplasm of colon: Secondary | ICD-10-CM

## 2014-08-19 DIAGNOSIS — K649 Unspecified hemorrhoids: Secondary | ICD-10-CM

## 2014-08-19 MED ORDER — MOVIPREP 100 G PO SOLR: 1.0000 | Freq: Once | ORAL | Status: DC

## 2014-08-19 NOTE — Progress Notes (Signed)
HPI: This is a   very pleasant 53 year old man whom I am meeting for the first time today.  2-3 months ago, anal discomfort and minor rectal bleeding.  PCP eval 1 month ago, small hemorrhoids.   No problems since sept.  Has pretty regular bowels, usually 2 BMs in AM then 1-2 more times.  Has been his pattern for a long time.  No signif abd pains.  No colon cancer in his family.  Never had a colonoscopy.  Review of systems: Pertinent positive and negative review of systems were noted in the above HPI section. Complete review of systems was performed and was otherwise normal.    Past Medical History  Diagnosis Date  . Migraine     f/u by neuro  . Snoring     s/p 3 sleep study: all neg , last 2011  . Anxiety   . BPH (benign prostatic hyperplasia)   . Stuttering     Past Surgical History  Procedure Laterality Date  . Nasal septum surgery  2010    Current Outpatient Prescriptions  Medication Sig Dispense Refill  . CAMBIA 50 MG PACK Take 1 packet by mouth as needed.  2  . omeprazole (PRILOSEC) 40 MG capsule TAKE 1 CAPSULE (40 MG TOTAL) BY MOUTH DAILY. 90 capsule 1  . sertraline (ZOLOFT) 50 MG tablet TAKE 1.5 TABLETS (75 MG TOTAL) BY MOUTH DAILY. 135 tablet 0  . zolmitriptan (ZOMIG) 5 MG tablet Take 5 mg by mouth as needed.       No current facility-administered medications for this visit.    Allergies as of 08/19/2014  . (No Known Allergies)    Family History  Problem Relation Age of Onset  . Stroke Mother     M late 250s  . Diabetes Maternal Grandmother   . Colon cancer Neg Hx   . Prostate cancer Neg Hx   . CAD Neg Hx     History   Social History  . Marital Status: Married    Spouse Name: Larita FifeLynn    Number of Children: 2  . Years of Education: Associate   Occupational History  . Curatormechanic    Social History Main Topics  . Smoking status: Never Smoker   . Smokeless tobacco: Never Used  . Alcohol Use: No  . Drug Use: No  . Sexual Activity:    Partners:  Female   Other Topics Concern  . Not on file   Social History Narrative   Lives with wife         Physical Exam: BP 122/72 mmHg  Pulse 64  Ht 5' 9.88" (1.775 m)  Wt 197 lb 8 oz (89.585 kg)  BMI 28.43 kg/m2 Constitutional: generally well-appearing Psychiatric: alert and oriented x3 Eyes: extraocular movements intact Mouth: oral pharynx moist, no lesions Neck: supple no lymphadenopathy Cardiovascular: heart regular rate and rhythm Lungs: clear to auscultation bilaterally Abdomen: soft, nontender, nondistended, no obvious ascites, no peritoneal signs, normal bowel sounds Extremities: no lower extremity edema bilaterally Skin: no lesions on visible extremities    Assessment and plan: 53 y.o. male with  recent minor rectal bleeding, routine risk for colon cancer  Small internal hemorrhoids were noted by his PCP. The bleeding came and went that was almost certainly hemorrhoidal. He understands that hemorrhoids can come and go. I will like to proceed with colonoscopy for colon cancer screening at his soonest convenience

## 2014-08-19 NOTE — Patient Instructions (Signed)
You will be set up for a colonoscopy for screening. Your minor rectal bleeding was almost certainly from small hemorrhoids.

## 2014-09-05 ENCOUNTER — Encounter: Payer: Self-pay | Admitting: Gastroenterology

## 2014-09-08 ENCOUNTER — Other Ambulatory Visit: Payer: Self-pay | Admitting: Internal Medicine

## 2014-10-18 ENCOUNTER — Telehealth: Payer: Self-pay | Admitting: Gastroenterology

## 2014-10-18 ENCOUNTER — Encounter: Payer: 59 | Admitting: Gastroenterology

## 2014-10-18 NOTE — Telephone Encounter (Signed)
Spoke with patient's wife, and he is vomiting and has a migraine.  He has a migraine and has already taken his zomig.  Not working. He was unable to take his am prep for today. Patient is quite ill and will re-schedule.

## 2014-10-18 NOTE — Telephone Encounter (Signed)
Ok, thanks.

## 2014-11-02 ENCOUNTER — Ambulatory Visit (INDEPENDENT_AMBULATORY_CARE_PROVIDER_SITE_OTHER): Payer: 59

## 2014-11-02 ENCOUNTER — Ambulatory Visit (INDEPENDENT_AMBULATORY_CARE_PROVIDER_SITE_OTHER): Payer: 59 | Admitting: Emergency Medicine

## 2014-11-02 VITALS — BP 129/81 | HR 71 | Temp 97.8°F | Resp 16 | Ht 70.25 in | Wt 192.5 lb

## 2014-11-02 DIAGNOSIS — M5441 Lumbago with sciatica, right side: Secondary | ICD-10-CM

## 2014-11-02 DIAGNOSIS — R350 Frequency of micturition: Secondary | ICD-10-CM

## 2014-11-02 LAB — POCT CBC
GRANULOCYTE PERCENT: 59.2 % (ref 37–80)
HEMATOCRIT: 45.4 % (ref 43.5–53.7)
Hemoglobin: 15.3 g/dL (ref 14.1–18.1)
LYMPH, POC: 2.8 (ref 0.6–3.4)
MCH, POC: 30.2 pg (ref 27–31.2)
MCHC: 33.7 g/dL (ref 31.8–35.4)
MCV: 89.4 fL (ref 80–97)
MID (CBC): 0.6 (ref 0–0.9)
MPV: 7 fL (ref 0–99.8)
POC Granulocyte: 5 (ref 2–6.9)
POC LYMPH PERCENT: 33.1 %L (ref 10–50)
POC MID %: 7.7 %M (ref 0–12)
Platelet Count, POC: 280 10*3/uL (ref 142–424)
RBC: 5.08 M/uL (ref 4.69–6.13)
RDW, POC: 13.6 %
WBC: 8.4 10*3/uL (ref 4.6–10.2)

## 2014-11-02 LAB — POCT UA - MICROSCOPIC ONLY
Casts, Ur, LPF, POC: NEGATIVE
Crystals, Ur, HPF, POC: NEGATIVE
RBC, URINE, MICROSCOPIC: NEGATIVE
YEAST UA: NEGATIVE

## 2014-11-02 LAB — POCT URINALYSIS DIPSTICK
Bilirubin, UA: NEGATIVE
Blood, UA: NEGATIVE
Glucose, UA: NEGATIVE
Ketones, UA: NEGATIVE
Leukocytes, UA: NEGATIVE
Nitrite, UA: NEGATIVE
Protein, UA: NEGATIVE
SPEC GRAV UA: 1.02
Urobilinogen, UA: 0.2
pH, UA: 5.5

## 2014-11-02 LAB — GLUCOSE, POCT (MANUAL RESULT ENTRY): POC Glucose: 82 mg/dl (ref 70–99)

## 2014-11-02 MED ORDER — HYDROCODONE-ACETAMINOPHEN 5-325 MG PO TABS: 1.0000 | ORAL_TABLET | Freq: Four times a day (QID) | ORAL | Status: DC | PRN

## 2014-11-02 MED ORDER — PREDNISONE 20 MG PO TABS: ORAL_TABLET | ORAL | Status: DC

## 2014-11-02 NOTE — Patient Instructions (Signed)
Sciatica Sciatica is pain, weakness, numbness, or tingling along the path of the sciatic nerve. The nerve starts in the lower back and runs down the back of each leg. The nerve controls the muscles in the lower leg and in the back of the knee, while also providing sensation to the back of the thigh, lower leg, and the sole of your foot. Sciatica is a symptom of another medical condition. For instance, nerve damage or certain conditions, such as a herniated disk or bone spur on the spine, pinch or put pressure on the sciatic nerve. This causes the pain, weakness, or other sensations normally associated with sciatica. Generally, sciatica only affects one side of the body. CAUSES   Herniated or slipped disc.  Degenerative disk disease.  A pain disorder involving the narrow muscle in the buttocks (piriformis syndrome).  Pelvic injury or fracture.  Pregnancy.  Tumor (rare). SYMPTOMS  Symptoms can vary from mild to very severe. The symptoms usually travel from the low back to the buttocks and down the back of the leg. Symptoms can include:  Mild tingling or dull aches in the lower back, leg, or hip.  Numbness in the back of the calf or sole of the foot.  Burning sensations in the lower back, leg, or hip.  Sharp pains in the lower back, leg, or hip.  Leg weakness.  Severe back pain inhibiting movement. These symptoms may get worse with coughing, sneezing, laughing, or prolonged sitting or standing. Also, being overweight may worsen symptoms. DIAGNOSIS  Your caregiver will perform a physical exam to look for common symptoms of sciatica. He or she may ask you to do certain movements or activities that would trigger sciatic nerve pain. Other tests may be performed to find the cause of the sciatica. These may include:  Blood tests.  X-rays.  Imaging tests, such as an MRI or CT scan. TREATMENT  Treatment is directed at the cause of the sciatic pain. Sometimes, treatment is not necessary  and the pain and discomfort goes away on its own. If treatment is needed, your caregiver may suggest:  Over-the-counter medicines to relieve pain.  Prescription medicines, such as anti-inflammatory medicine, muscle relaxants, or narcotics.  Applying heat or ice to the painful area.  Steroid injections to lessen pain, irritation, and inflammation around the nerve.  Reducing activity during periods of pain.  Exercising and stretching to strengthen your abdomen and improve flexibility of your spine. Your caregiver may suggest losing weight if the extra weight makes the back pain worse.  Physical therapy.  Surgery to eliminate what is pressing or pinching the nerve, such as a bone spur or part of a herniated disk. HOME CARE INSTRUCTIONS   Only take over-the-counter or prescription medicines for pain or discomfort as directed by your caregiver.  Apply ice to the affected area for 20 minutes, 3-4 times a day for the first 48-72 hours. Then try heat in the same way.  Exercise, stretch, or perform your usual activities if these do not aggravate your pain.  Attend physical therapy sessions as directed by your caregiver.  Keep all follow-up appointments as directed by your caregiver.  Do not wear high heels or shoes that do not provide proper support.  Check your mattress to see if it is too soft. A firm mattress may lessen your pain and discomfort. SEEK IMMEDIATE MEDICAL CARE IF:   You lose control of your bowel or bladder (incontinence).  You have increasing weakness in the lower back, pelvis, buttocks,   or legs.  You have redness or swelling of your back.  You have a burning sensation when you urinate.  You have pain that gets worse when you lie down or awakens you at night.  Your pain is worse than you have experienced in the past.  Your pain is lasting longer than 4 weeks.  You are suddenly losing weight without reason. MAKE SURE YOU:  Understand these  instructions.  Will watch your condition.  Will get help right away if you are not doing well or get worse. Document Released: 08/24/2001 Document Revised: 02/29/2012 Document Reviewed: 01/09/2012 Exit[employer] Patient Information 2015 [employer]. This information is not intended to replace advice given to you by your health care provider. Make sure you discuss any questions you have with your health care provider.  

## 2014-11-02 NOTE — Progress Notes (Addendum)
Subjective:  This chart was scribed for Nena Jordan, MD by Dellis Filbert, ED Scribe at [facility].The patient was seen in exam room 02 and the patient's care was started at 10:53 AM.   Patient ID: Daniel Petersen, male    DOB: 09/07/1961, 54 y.o.   MRN: [id number] Chief Complaint  Patient presents with  . Flank Pain    Right  . Urinary Frequency    HPI  HPI Comments: Daniel Petersen is a 54 y.o. male who presents to [facility] complaining of right sided lower back pain ongoing for about 1 1/2 weeks ago. He rates the pain as 9.5/10. The pain has never been this bad. Elevation of his right leg causes severe pain. He has not had any back surgeries. 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 No weakness or numbness in his legs.  Patient Active Problem List   Diagnosis Date Noted  . Blood per rectum 06/14/2014  . Annual physical exam 07/17/2012  . BPH (benign prostatic hyperplasia) 07/14/2011  . Migraine   . Snoring   . Anxiety    Past Medical History  Diagnosis Date  . Migraine     f/u by neuro  . Snoring     s/p 3 sleep study: all neg , last 2011  . Anxiety   . BPH (benign prostatic hyperplasia)   . Stuttering    Past Surgical History  Procedure Laterality Date  . Nasal septum surgery  2010   No Known Allergies Prior to Admission medications   Medication Sig Start Date End Date Taking? Authorizing Provider  CAMBIA 50 MG PACK Take 1 packet by mouth as needed. 07/08/14  Yes Historical Provider, MD  omeprazole (PRILOSEC) 40 MG capsule TAKE 1 CAPSULE (40 MG TOTAL) BY MOUTH DAILY. 08/07/14  Yes Colon Branch, MD  sertraline (ZOLOFT) 50 MG tablet TAKE 1.5 TABLETS (75 MG TOTAL) BY MOUTH DAILY. 09/09/14  Yes Colon Branch, MD  zolmitriptan (ZOMIG) 5 MG tablet Take 5 mg by mouth as needed.     Yes Historical Provider, MD  MOVIPREP 100 G SOLR Take 1 kit (200 g total) by mouth once. Patient not taking: Reported on 11/02/2014 08/19/14   Milus Banister, MD   History    Social History  . Marital Status: Married    Spouse Name: Jeani Hawking  . Number of Children: 2  . Years of Education: Associate   Occupational History  . Dealer    Social History Main Topics  . Smoking status: Never Smoker   . Smokeless tobacco: Never Used  . Alcohol Use: No  . Drug Use: No  . Sexual Activity:    Partners: Female   Other Topics Concern  . Not on file   Social History Narrative   Lives with wife     Review of Systems  Musculoskeletal: Positive for back pain.  Neurological: Negative for weakness and numbness.      Objective:   Physical Exam  Constitutional: He is oriented to person, place, and time. He appears well-developed and well-nourished. No distress.  HENT:  Head: Normocephalic and atraumatic.  Eyes: Pupils are equal, round, and reactive to light.  Neck: Normal range of motion.  Cardiovascular: Normal rate, regular rhythm and normal heart sounds.   No murmur heard. Pulmonary/Chest: Effort normal and breath sounds normal. No respiratory distress.  Musculoskeletal: Normal range of motion.  Right straight leg raise is positive at 80 degrees. Right hip flexion is  weak all other right hip strengths are normal expect right hip flexion.  Neurological: He is alert and oriented to person, place, and time.  Skin: Skin is warm and dry.  Psychiatric: He has a normal mood and affect. His behavior is normal.  Nursing note and vitals reviewed.  Results for orders placed or performed in visit on 11/02/14  POCT urinalysis dipstick  Result Value Ref Range   Color, UA yellow    Clarity, UA clear    Glucose, UA neg    Bilirubin, UA neg    Ketones, UA neg    Spec Grav, UA 1.020    Blood, UA neg    pH, UA 5.5    Protein, UA neg    Urobilinogen, UA 0.2    Nitrite, UA neg    Leukocytes, UA Negative   POCT UA - Microscopic Only  Result Value Ref Range   WBC, Ur, HPF, POC 0-1    RBC, urine, microscopic neg    Bacteria, U Microscopic trace    Mucus, UA  trace    Epithelial cells, urine per micros 0-2    Crystals, Ur, HPF, POC neg    Casts, Ur, LPF, POC neg    Yeast, UA neg   POCT CBC  Result Value Ref Range   WBC 8.4 4.6 - 10.2 K/uL   Lymph, poc 2.8 0.6 - 3.4   POC LYMPH PERCENT 33.1 10 - 50 %L   MID (cbc) 0.6 0 - 0.9   POC MID % 7.7 0 - 12 %M   POC Granulocyte 5.0 2 - 6.9   Granulocyte percent 59.2 37 - 80 %G   RBC 5.08 4.69 - 6.13 M/uL   Hemoglobin 15.3 14.1 - 18.1 g/dL   HCT, POC 45.4 43.5 - 53.7 %   MCV 89.4 80 - 97 fL   MCH, POC 30.2 27 - 31.2 pg   MCHC 33.7 31.8 - 35.4 g/dL   RDW, POC 13.6 %   Platelet Count, POC 280 142 - 424 K/uL   MPV 7.0 0 - 99.8 fL  POCT glucose (manual entry)  Result Value Ref Range   POC Glucose 82 70 - 99 mg/dl   [facility] reading (PRIMARY) by  Dr.Lorynn Moeser Mild arthritis        Assessment & Plan:  Patient has urinary frequency. He has this along with his low back pain and right hip flexor weakness. He does not have any incontinence of stool or urine. We do need to proceed with MRI of the lumbar spine. Will put this in as an urgent order. He is placed on prednisone and hydrocodone for pain I personally performed the services described in this documentation, which was scribed in my presence. The recorded information has been reviewed and is accurate.

## 2014-11-02 NOTE — Progress Notes (Deleted)
   Subjective:    Patient ID: Daniel Petersen, male    DOB: [birth year], 54 y.o.   MRN: [id number]  HPI    Review of Systems     Objective:   Physical Exam        Assessment & Plan:

## 2014-11-13 ENCOUNTER — Other Ambulatory Visit: Payer: Self-pay | Admitting: Emergency Medicine

## 2014-11-22 ENCOUNTER — Other Ambulatory Visit: Payer: Self-pay | Admitting: Emergency Medicine

## 2014-12-03 ENCOUNTER — Ambulatory Visit
Admission: RE | Admit: 2014-12-03 | Discharge: 2014-12-03 | Disposition: A | Discharge status: Home / Self Care | Payer: 59 | Source: Ambulatory Visit | Attending: Emergency Medicine | Admitting: Emergency Medicine

## 2014-12-03 DIAGNOSIS — R350 Frequency of micturition: Secondary | ICD-10-CM

## 2014-12-03 DIAGNOSIS — M5441 Lumbago with sciatica, right side: Secondary | ICD-10-CM

## 2015-02-03 ENCOUNTER — Other Ambulatory Visit: Payer: Self-pay | Admitting: Internal Medicine

## 2015-03-04 ENCOUNTER — Other Ambulatory Visit: Payer: Self-pay | Admitting: Internal Medicine

## 2015-03-31 ENCOUNTER — Encounter: Payer: Self-pay | Admitting: Gastroenterology

## 2015-04-03 ENCOUNTER — Other Ambulatory Visit: Payer: Self-pay | Admitting: Internal Medicine

## 2015-05-05 ENCOUNTER — Other Ambulatory Visit: Payer: Self-pay | Admitting: Internal Medicine

## 2015-05-26 ENCOUNTER — Ambulatory Visit (AMBULATORY_SURGERY_CENTER): Payer: Self-pay | Admitting: *Deleted

## 2015-05-26 VITALS — Ht 71.0 in | Wt 200.0 lb

## 2015-05-26 DIAGNOSIS — Z1211 Encounter for screening for malignant neoplasm of colon: Secondary | ICD-10-CM

## 2015-05-26 NOTE — Progress Notes (Signed)
Patient denies any allergies to eggs or soy. Patient denies any problems with anesthesia/sedation. Patient denies any oxygen use at home and does not take any diet/weight loss medications.  

## 2015-06-01 ENCOUNTER — Other Ambulatory Visit: Payer: Self-pay | Admitting: Internal Medicine

## 2015-06-09 ENCOUNTER — Encounter: Payer: Self-pay | Admitting: Gastroenterology

## 2015-06-09 ENCOUNTER — Ambulatory Visit (AMBULATORY_SURGERY_CENTER): Payer: 59 | Admitting: Gastroenterology

## 2015-06-09 VITALS — BP 125/69 | HR 75 | Temp 97.6°F | Resp 66 | Ht 71.0 in | Wt 200.0 lb

## 2015-06-09 DIAGNOSIS — Z1211 Encounter for screening for malignant neoplasm of colon: Secondary | ICD-10-CM

## 2015-06-09 MED ORDER — SODIUM CHLORIDE 0.9 % IV SOLN: 500.0000 mL | INTRAVENOUS | Status: DC

## 2015-06-09 NOTE — Patient Instructions (Signed)
Normal colonoscopy today. Repeat colonoscopy in 10 years.  Call us with any questions or concerns. Thank you!  YOU HAD AN ENDOSCOPIC PROCEDURE TODAY AT THE [facility]:   Refer to the procedure report that was given to you for any specific questions about what was found during the examination.  If the procedure report does not answer your questions, please call your gastroenterologist to clarify.  If you requested that your care partner not be given the details of your procedure findings, then the procedure report has been included in a sealed envelope for you to review at your convenience later.  YOU SHOULD EXPECT: Some feelings of bloating in the abdomen. Passage of more gas than usual.  Walking can help get rid of the air that was put into your GI tract during the procedure and reduce the bloating. If you had a lower endoscopy (such as a colonoscopy or flexible sigmoidoscopy) you may notice spotting of blood in your stool or on the toilet paper. If you underwent a bowel prep for your procedure, you may not have a normal bowel movement for a few days.  Please Note:  You might notice some irritation and congestion in your nose or some drainage.  This is from the oxygen used during your procedure.  There is no need for concern and it should clear up in a day or so.  SYMPTOMS TO REPORT IMMEDIATELY:   Following lower endoscopy (colonoscopy or flexible sigmoidoscopy):  Excessive amounts of blood in the stool  Significant tenderness or worsening of abdominal pains  Swelling of the abdomen that is new, acute  Fever of 100F or higher   For urgent or emergent issues, a gastroenterologist can be reached at any hour by calling [phone].   DIET: Your first meal following the procedure should be a small meal and then it is ok to progress to your normal diet. Heavy or fried foods are harder to digest and may make you feel nauseous or bloated.  Likewise, meals heavy in dairy and  vegetables can increase bloating.  Drink plenty of fluids but you should avoid alcoholic beverages for 24 hours.  ACTIVITY:  You should plan to take it easy for the rest of today and you should NOT DRIVE or use heavy machinery until tomorrow (because of the sedation medicines used during the test).    FOLLOW UP: Our staff will call the number listed on your records the next business day following your procedure to check on you and address any questions or concerns that you may have regarding the information given to you following your procedure. If we do not reach you, we will leave a message.  However, if you are feeling well and you are not experiencing any problems, there is no need to return our call.  We will assume that you have returned to your regular daily activities without incident.  If any biopsies were taken you will be contacted by phone or by letter within the next 1-3 weeks.  Please call us at [phone] if you have not heard about the biopsies in 3 weeks.    SIGNATURES/CONFIDENTIALITY: You and/or your care partner have signed paperwork which will be entered into your electronic medical record.  These signatures attest to the fact that that the information above on your After Visit Summary has been reviewed and is understood.  Full responsibility of the confidentiality of this discharge information lies with you and/or your care-partner.

## 2015-06-09 NOTE — Progress Notes (Signed)
Transferred to recovery room. A/O x3, pleased with MAC.  VSS.  Report to Robbin, RN. 

## 2015-06-09 NOTE — Op Note (Signed)
[facility] [address] [place], [address]   COLONOSCOPY PROCEDURE REPORT  PATIENT: Daniel Petersen, Daniel Petersen  MR#: [id number] BIRTHDATE: 1961-06-21 , 53  yrs. old GENDER: male ENDOSCOPIST: Rachael Fee, MD REFERRED WU:XLKG Drue Novel, M.D. PROCEDURE DATE:  06/09/2015 PROCEDURE:   Colonoscopy, screening First Screening Colonoscopy - Avg.  risk and is 50 yrs.  old or older Yes.  Prior Negative Screening - Now for repeat screening. N/A  History of Adenoma - Now for follow-up colonoscopy & has been > or = to 3 yrs.  N/A  Recommend repeat exam, <10 yrs? No ASA CLASS:   Class II INDICATIONS:Screening for colonic neoplasia and Colorectal Neoplasm Risk Assessment for this procedure is average risk. MEDICATIONS: Monitored anesthesia care and Propofol 150 mg IV  DESCRIPTION OF PROCEDURE:   After the risks benefits and alternatives of the procedure were thoroughly explained, informed consent was obtained.  The digital rectal exam revealed no abnormalities of the rectum.   The [id number]  endoscope was introduced through the anus and advanced to the cecum, which was identified by both the appendix and ileocecal valve. No adverse events experienced.   The quality of the prep was good.  The instrument was then slowly withdrawn as the colon was fully examined. Estimated blood loss is zero unless otherwise noted in this procedure report.   COLON FINDINGS: A normal appearing cecum, ileocecal valve, and appendiceal orifice were identified.  The ascending, transverse, descending, sigmoid colon, and rectum appeared unremarkable. Retroflexed views revealed no abnormalities. The time to cecum = 1.4 Withdrawal time = 6.0   The scope was withdrawn and the procedure completed. COMPLICATIONS: There were no immediate complications.  ENDOSCOPIC IMPRESSION: Normal colonoscopy  RECOMMENDATIONS: You should continue to follow colorectal cancer screening guidelines for "routine risk"  patients with a repeat colonoscopy in 10 years.   eSigned:  Rachael Fee, MD 06/09/2015 10:30 AM

## 2015-06-10 ENCOUNTER — Telehealth: Payer: Self-pay | Admitting: Emergency Medicine

## 2015-06-10 NOTE — Telephone Encounter (Signed)
Left message, no identifier 

## 2015-06-26 ENCOUNTER — Other Ambulatory Visit: Payer: Self-pay

## 2015-06-27 ENCOUNTER — Ambulatory Visit (INDEPENDENT_AMBULATORY_CARE_PROVIDER_SITE_OTHER): Payer: 59 | Admitting: Internal Medicine

## 2015-06-27 ENCOUNTER — Encounter: Payer: Self-pay | Admitting: Internal Medicine

## 2015-06-27 ENCOUNTER — Encounter (INDEPENDENT_AMBULATORY_CARE_PROVIDER_SITE_OTHER): Payer: Self-pay

## 2015-06-27 VITALS — BP 118/78 | HR 82 | Temp 98.1°F | Ht 71.0 in | Wt 199.4 lb

## 2015-06-27 DIAGNOSIS — F419 Anxiety disorder, unspecified: Secondary | ICD-10-CM

## 2015-06-27 DIAGNOSIS — K589 Irritable bowel syndrome without diarrhea: Secondary | ICD-10-CM

## 2015-06-27 DIAGNOSIS — Z09 Encounter for follow-up examination after completed treatment for conditions other than malignant neoplasm: Secondary | ICD-10-CM

## 2015-06-27 DIAGNOSIS — N400 Enlarged prostate without lower urinary tract symptoms: Secondary | ICD-10-CM | POA: Diagnosis not present

## 2015-06-27 LAB — URINALYSIS, ROUTINE W REFLEX MICROSCOPIC
BILIRUBIN URINE: NEGATIVE
HGB URINE DIPSTICK: NEGATIVE
Ketones, ur: NEGATIVE
LEUKOCYTES UA: NEGATIVE
NITRITE: NEGATIVE
PH: 5.5 (ref 5.0–8.0)
RBC / HPF: NONE SEEN (ref 0–?)
Specific Gravity, Urine: 1.025 (ref 1.000–1.030)
TOTAL PROTEIN, URINE-UPE24: NEGATIVE
URINE GLUCOSE: NEGATIVE
UROBILINOGEN UA: 0.2 (ref 0.0–1.0)

## 2015-06-27 LAB — BASIC METABOLIC PANEL
BUN: 20 mg/dL (ref 6–23)
CHLORIDE: 102 meq/L (ref 96–112)
CO2: 29 meq/L (ref 19–32)
Calcium: 9.2 mg/dL (ref 8.4–10.5)
Creatinine, Ser: 1.03 mg/dL (ref 0.40–1.50)
GFR: 80.04 mL/min (ref >60.00)
Glucose, Bld: 94 mg/dL (ref 70–99)
POTASSIUM: 4.4 meq/L (ref 3.5–5.1)
SODIUM: 138 meq/L (ref 135–145)

## 2015-06-27 LAB — PSA: PSA: 2.19 ng/mL (ref 0.10–4.00)

## 2015-06-27 LAB — TSH: TSH: 1.12 u[IU]/mL (ref 0.35–4.50)

## 2015-06-27 MED ORDER — TAMSULOSIN HCL 0.4 MG PO CAPS: 0.4000 mg | ORAL_CAPSULE | Freq: Every day | ORAL | Status: DC

## 2015-06-27 MED ORDER — FLUOXETINE HCL 20 MG PO TABS: 40.0000 mg | ORAL_TABLET | Freq: Every day | ORAL | Status: DC

## 2015-06-27 NOTE — Progress Notes (Signed)
Pre visit review using our clinic review tool, if applicable. No additional management support is needed unless otherwise documented below in the visit note. 

## 2015-06-27 NOTE — Progress Notes (Signed)
Subjective:    Patient ID: Daniel Petersen, male    DOB: 10/21/1960, 54 y.o.   MRN: [id number]  DOS:  06/27/2015 Type of visit - description : Acute, several issues Interval history: GI: sx started even before his colonoscopy 05-2015: He has the urgency to have a bowel movement and when he goes is very little stool,  has to strain. Has several BMs daily. Also for the last few months having urinary frequency and when he goes he finds it difficult to urinate. Anxiety: Wife is partially disabled, needs a lot of help, he is under stress, on sertraline for a while, for the last 2 months has been more anxious than before. Change sertraline?   Review of Systems Denies nausea, vomiting, diarrhea or abdominal pain. Previously he had blood in the stools: No further symptoms No depression per se, sleeping okay. No dysuria or gross hematuria.  Past Medical History  Diagnosis Date  . Migraine     f/u by neuro  . Snoring     s/p 3 sleep study: all neg , last 2011  . Anxiety   . BPH (benign prostatic hyperplasia)   . Stuttering     Past Surgical History  Procedure Laterality Date  . Nasal septum surgery  2010    Social History   Social History  . Marital Status: Married    Spouse Name: [name]  . Number of Children: 2  . Years of Education: Associate   Occupational History  . Curator    Social History Main Topics  . Smoking status: Never Smoker   . Smokeless tobacco: Never Used  . Alcohol Use: No  . Drug Use: No  . Sexual Activity:    Partners: Female   Other Topics Concern  . Not on file   Social History Narrative   Lives with wife who is in a wheelchair, has to drive her ; she still works    Pt has his own job        Medication List       This list is accurate as of: 06/27/15 11:59 PM.  Always use your most recent med list.               CAMBIA 50 MG Pack  Generic drug:  Diclofenac Potassium  Take 1 packet by mouth as needed.     FLUoxetine 20 MG tablet    Commonly known as:  PROZAC  Take 2 tablets (40 mg total) by mouth daily.     omeprazole 40 MG capsule  Commonly known as:  PRILOSEC  Take 1 capsule (40 mg total) by mouth daily.     tamsulosin 0.4 MG Caps capsule  Commonly known as:  FLOMAX  Take 1 capsule (0.4 mg total) by mouth daily.     zolmitriptan 5 MG tablet  Commonly known as:  ZOMIG  Take 5 mg by mouth as needed.           Objective:   Physical Exam BP 118/78 mmHg  Pulse 82  Temp(Src) 98.1 F (36.7 C) (Oral)  Ht  (1.803 m)  Wt 199 lb 6 oz (90.436 kg)  BMI 27.82 kg/m2  SpO2 95% General:   Well developed, well nourished . NAD.  HEENT:  Normocephalic . Face symmetric, atraumatic Neck: No thyromegaly Lungs:  CTA B Normal respiratory effort, no intercostal retractions, no accessory muscle use. Heart: RRR,  no murmur.  no pretibial edema bilaterally  Abdomen:  Not distended, soft, non-tender. No  rebound or rigidity. No mass,organomegaly Rectal:  External abnormalities: none. Normal sphincter tone. No rectal masses or tenderness.  No stools found Prostate: Prostate gland firm and smooth, slt enlargement, no nodularity, tenderness, mass, asymmetry or induration.  Skin: Not pale. Not jaundice Neurologic:  alert & oriented X3.  Speech normal, gait appropriate for age and unassisted Psych--  Cognition and judgment appear intact.  Cooperative with normal attention span and concentration.  Behavior appropriate. No anxious or depressed appearing.    Assessment & Plan:   Assessment > Anxiety Migraines, f/u neuro stuttering  Snoring --sleep study 3, all  Negative,  last 2011 BPH  Plan: Anxiety: Symptoms not well controlled, currently on sertraline 75 mg and is willing to change. Recommend to change to Prozac 40 mg. See instructions Problems with defecation: IBS? Recent colonoscopy 05-2015 normal. Recommend probiotics and MiraLAX daily. BPH: More symptomatic than before. In the past Flomax helped.  Will restart Flomax, check a UA and a PSA. RTC 2 months for a physical exam Declined a flu shot

## 2015-06-27 NOTE — Patient Instructions (Signed)
Get your blood work before you leave    For your problems with stomach: MiraLAX 17 g daily with fluids  Start a probiotic daily  Stop sertraline Start fluoxetine 20 mg: 1 tablet daily for 2 weeks Then 1.5 tablets daily for 2 weeks  Then 2 tablets a day until you see me   Next visit  for a   physical exam in 2 months, fasting   Please schedule an appointment at the front desk

## 2015-06-28 DIAGNOSIS — Z09 Encounter for follow-up examination after completed treatment for conditions other than malignant neoplasm: Secondary | ICD-10-CM | POA: Insufficient documentation

## 2015-06-28 LAB — URINE CULTURE
COLONY COUNT: NO GROWTH
ORGANISM ID, BACTERIA: NO GROWTH

## 2015-06-28 NOTE — Assessment & Plan Note (Signed)
Anxiety: Symptoms not well controlled, currently on sertraline 75 mg and is willing to change. Recommend to change to Prozac 40 mg. See instructions Problems with defecation: IBS? Recent colonoscopy 05-2015 normal. Recommend probiotics and MiraLAX daily. BPH: More symptomatic than before. In the past Flomax helped. Will restart Flomax, check a UA and a PSA. RTC 2 months for a physical exam Declined a flu shot

## 2015-07-01 ENCOUNTER — Other Ambulatory Visit: Payer: Self-pay | Admitting: Internal Medicine

## 2015-09-04 ENCOUNTER — Telehealth: Payer: Self-pay | Admitting: Behavioral Health

## 2015-09-04 ENCOUNTER — Encounter: Payer: Self-pay | Admitting: Behavioral Health

## 2015-09-04 NOTE — Telephone Encounter (Signed)
Pre-Visit Call completed with patient and chart updated.   Pre-Visit Info documented in Specialty Comments under [employer].    

## 2015-09-04 NOTE — Telephone Encounter (Signed)
Unable to reach patient at time of Pre-Visit Call.  Left message for patient to return call when available.    

## 2015-09-04 NOTE — Addendum Note (Signed)
Addended by: Harold BarbanBYRD, RONECIA E on: 09/04/2015 11:14 AM   Modules accepted: Orders, Medications

## 2015-09-05 ENCOUNTER — Ambulatory Visit (INDEPENDENT_AMBULATORY_CARE_PROVIDER_SITE_OTHER): Payer: 59 | Admitting: Internal Medicine

## 2015-09-05 ENCOUNTER — Encounter: Payer: Self-pay | Admitting: Internal Medicine

## 2015-09-05 VITALS — BP 122/80 | HR 76 | Temp 97.9°F | Ht 71.0 in | Wt 196.4 lb

## 2015-09-05 DIAGNOSIS — Z0000 Encounter for general adult medical examination without abnormal findings: Secondary | ICD-10-CM | POA: Diagnosis not present

## 2015-09-05 DIAGNOSIS — Z09 Encounter for follow-up examination after completed treatment for conditions other than malignant neoplasm: Secondary | ICD-10-CM

## 2015-09-05 DIAGNOSIS — Z114 Encounter for screening for human immunodeficiency virus [HIV]: Secondary | ICD-10-CM

## 2015-09-05 LAB — LIPID PANEL
CHOLESTEROL: 152 mg/dL (ref 0–200)
HDL: 46.6 mg/dL (ref >39.00)
LDL CALC: 82 mg/dL (ref 0–99)
NONHDL: 105
Total CHOL/HDL Ratio: 3
Triglycerides: 115 mg/dL (ref 0.0–149.0)
VLDL: 23 mg/dL (ref 0.0–40.0)

## 2015-09-05 LAB — ALT: ALT: 24 U/L (ref 0–53)

## 2015-09-05 LAB — AST: AST: 19 U/L (ref 0–37)

## 2015-09-05 MED ORDER — SERTRALINE HCL 100 MG PO TABS: 150.0000 mg | ORAL_TABLET | Freq: Every day | ORAL | Status: DC

## 2015-09-05 NOTE — Assessment & Plan Note (Signed)
Td 09 per pt Declined a flu shot, benefits discussed CCS: Cscope 05-2015, next per GI prostate ca screening: DRE PSA 06-2015 wnl   Diet-exercise discussed Labs

## 2015-09-05 NOTE — Patient Instructions (Signed)
BEFORE YOU LEAVE THE OFFICE:  GO TO THE LAB  Get the blood work    GO TO THE FRONT DESK Schedule a routine office visit or check up to be done in  3 months. No  fasting   Front desk:  15         AFTER YOU LEAVE THE OFFICE:   Start sertraline 100 mg: For 2 weeks take half tablet daily For 2 weeks take 1 tablet daily Then take 1.5 tablet every day

## 2015-09-05 NOTE — Progress Notes (Signed)
Subjective:    Patient ID: Daniel Petersen, male    DOB: [birth year], 54 y.o.   MRN: [id number]  DOS:  09/05/2015 Type of visit - description : cPX Interval history: Since the last office visit, he tried Flomax,, self dc it because of "side effects" although he could not elaborate on what type of side effect he had. Currently BPH symptoms are mild. Anxiety: tried prozac few times, again is stated he had "weird side effects" but couldn't elaborate or explain the s/e. Currently not taking SSRIs, symptoms are about the same, he is willing to go back on sertraline.No suicidal ideas, no insomnia.   Review of Systems  Constitutional: No fever. No chills. No unexplained wt changes. No unusual sweats  HEENT: No dental problems, no ear discharge, no facial swelling, no voice changes. No eye discharge, no eye  redness , no  intolerance to light   Respiratory: No wheezing , no  difficulty breathing. No cough , no mucus production  Cardiovascular: No CP, no leg swelling , no  Palpitations  GI: no nausea, no vomiting, no diarrhea , no  abdominal pain.  No blood in the stools. No dysphagia, no odynophagia    Endocrine: No polyphagia, no polyuria , no polydipsia  GU: see abobe   Musculoskeletal: No joint swellings or unusual aches or pains  Skin: No change in the color of the skin, palor , no  Rash  Allergic, immunologic: No environmental allergies , no  food allergies  Neurological: No dizziness no  syncope. No headaches. No diplopia, no slurred, no slurred speech, no motor deficits, no facial  Numbness  Hematological: No enlarged lymph nodes, no easy bruising , no unusual bleedings  Psychiatry: No suicidal ideas, no hallucinations, no beavior problems, no confusion.  See above  Past Medical History  Diagnosis Date  . Migraine     f/u by neuro  . Snoring     s/p 3 sleep study: all neg , last 2011  . Anxiety   . BPH (benign prostatic hyperplasia)   . Stuttering     Past  Surgical History  Procedure Laterality Date  . Nasal septum surgery  2010    Social History   Social History  . Marital Status: Married    Spouse Name: Larita FifeLynn  . Number of Children: 2  . Years of Education: Associate   Occupational History  . Curatormechanic    Social History Main Topics  . Smoking status: Never Smoker   . Smokeless tobacco: Never Used  . Alcohol Use: No  . Drug Use: No  . Sexual Activity:    Partners: Female   Other Topics Concern  . Not on file   Social History Narrative   Lives with wife who is in a wheelchair, has to drive her ; she still works    Pt has his own job        Medication List       This list is accurate as of: 09/05/15 11:59 PM.  Always use your most recent med list.               CAMBIA 50 MG Pack  Generic drug:  Diclofenac Potassium  Take 1 packet by mouth as needed.     omeprazole 40 MG capsule  Commonly known as:  PRILOSEC  Take 1 capsule (40 mg total) by mouth daily.     sertraline 100 MG tablet  Commonly known as:  ZOLOFT  Take 1.5 tablets (150 mg  total) by mouth daily.     zolmitriptan 5 MG tablet  Commonly known as:  ZOMIG  Take 5 mg by mouth as needed.           Objective:   Physical Exam BP 122/80 mmHg  Pulse 76  Temp(Src) 97.9 F (36.6 C) (Oral)  Ht  (1.803 m)  Wt 196 lb 6 oz (89.075 kg)  BMI 27.40 kg/m2  SpO2 98% General:   Well developed, well nourished . NAD.  Neck: No  thyromegaly , normal carotid pulse HEENT:  Normocephalic . Face symmetric, atraumatic Lungs:  CTA B Normal respiratory effort, no intercostal retractions, no accessory muscle use. Heart: RRR,  no murmur.  No pretibial edema bilaterally  Abdomen:  Not distended, soft, non-tender. No rebound or rigidity.   Skin: Exposed areas without rash. Not pale. Not jaundice Neurologic:  alert & oriented X3.  Speech normal, gait appropriate for age and unassisted Strength symmetric and appropriate for age.  Psych: Cognition and  judgment appear intact.  Cooperative with normal attention span and concentration.  Behavior appropriate. No anxious or depressed appearing.     Assessment & Plan:   Assessment > Anxiety GERD Migraines, f/u neuro stuttering  Snoring --sleep study 3, all  Negative,  last 2011 BPH  Plan: Anxiety: Intolerant to Prozac, we agreed to go back on sertraline sertraline. See AVS Snoring: Still issue to the patient,states that wife has a difficult time sleeping due to his snoring. recommend to use a dental appliance to diminish snoring BPH: flomax intolerant d/t ill defined s/e. Currently oligosymptomatic RTC 3 months

## 2015-09-05 NOTE — Progress Notes (Signed)
Pre visit review using our clinic review tool, if applicable. No additional management support is needed unless otherwise documented below in the visit note. 

## 2015-09-06 LAB — HIV ANTIBODY (ROUTINE TESTING W REFLEX): HIV: NONREACTIVE

## 2015-09-08 NOTE — Assessment & Plan Note (Signed)
Anxiety: Intolerant to Prozac, we agreed to go back on sertraline sertraline. See AVS Snoring: Still issue to the patient,states that wife has a difficult time sleeping due to his snoring. recommend to use a dental appliance to diminish snoring BPH: flomax intolerant d/t ill defined s/e. Currently oligosymptomatic RTC 3 months

## 2015-09-14 DIAGNOSIS — N400 Enlarged prostate without lower urinary tract symptoms: Secondary | ICD-10-CM

## 2015-09-14 DIAGNOSIS — R3915 Urgency of urination: Secondary | ICD-10-CM

## 2015-09-14 HISTORY — DX: Urgency of urination: R39.15

## 2015-09-14 HISTORY — DX: Benign prostatic hyperplasia without lower urinary tract symptoms: N40.0

## 2015-09-30 ENCOUNTER — Telehealth: Payer: Self-pay

## 2015-09-30 ENCOUNTER — Encounter: Payer: Self-pay | Admitting: Family Medicine

## 2015-09-30 NOTE — Telephone Encounter (Signed)
Completed paperwork today.

## 2015-09-30 NOTE — Telephone Encounter (Signed)
Patient mailed over FMLA forms to be completed by Dr Patsy Lager, i filled out what I could from the OV notes and from the last FMLA forms that were completed. I highlighted the rest that needs to be completed, please return to the FMLA/Disability box at the [employer] checkout desk within 5-7 business days. I will place them in your box on 09/30/15.

## 2015-10-03 NOTE — Telephone Encounter (Signed)
Patient scanned, and called the patient to come and pick up.

## 2015-12-08 ENCOUNTER — Other Ambulatory Visit: Payer: Self-pay

## 2015-12-08 MED ORDER — OMEPRAZOLE 40 MG PO CPDR: 40.0000 mg | DELAYED_RELEASE_CAPSULE | Freq: Every day | ORAL | Status: DC

## 2015-12-08 MED ORDER — SERTRALINE HCL 100 MG PO TABS: 150.0000 mg | ORAL_TABLET | Freq: Every day | ORAL | Status: DC

## 2015-12-10 ENCOUNTER — Encounter: Payer: Self-pay | Admitting: Internal Medicine

## 2015-12-10 ENCOUNTER — Ambulatory Visit (INDEPENDENT_AMBULATORY_CARE_PROVIDER_SITE_OTHER): Payer: 59 | Admitting: Internal Medicine

## 2015-12-10 VITALS — BP 122/84 | HR 82 | Temp 98.4°F | Ht 71.0 in | Wt 194.0 lb

## 2015-12-10 DIAGNOSIS — Z09 Encounter for follow-up examination after completed treatment for conditions other than malignant neoplasm: Secondary | ICD-10-CM

## 2015-12-10 DIAGNOSIS — K589 Irritable bowel syndrome without diarrhea: Secondary | ICD-10-CM | POA: Diagnosis not present

## 2015-12-10 DIAGNOSIS — F419 Anxiety disorder, unspecified: Secondary | ICD-10-CM

## 2015-12-10 DIAGNOSIS — R399 Unspecified symptoms and signs involving the genitourinary system: Secondary | ICD-10-CM | POA: Diagnosis not present

## 2015-12-10 NOTE — Patient Instructions (Signed)
GO TO THE FRONT DESK Schedule your next appointment for a  Routine check up When?   6 months Fasting?  No

## 2015-12-10 NOTE — Progress Notes (Signed)
Pre visit review using our clinic review tool, if applicable. No additional management support is needed unless otherwise documented below in the visit note. 

## 2015-12-10 NOTE — Progress Notes (Signed)
Subjective:    Patient ID: Daniel Petersen, male    DOB: 1960/09/30, 55 y.o.   MRN: [id number]  DOS:  12/10/2015 Type of visit - description : Follow-up from previous visit Interval history: Anxiety: on Sertraline, feeling great. Continue with GI symptoms: States he goes to the bathroom 3 or 4 times a day, with some urgency, straining. Continue with LUTS, described the urination is frequent, urgent, sometimes has accidents.   Review of Systems Denies weight loss No nausea, vomiting, blood in the stools No gross hematuria.   Past Medical History  Diagnosis Date  . Migraine     f/u by neuro  . Snoring     s/p 3 sleep study: all neg , last 2011  . Anxiety   . BPH (benign prostatic hyperplasia)   . Stuttering     Past Surgical History  Procedure Laterality Date  . Nasal septum surgery  2010    Social History   Social History  . Marital Status: Married    Spouse Name: [name]  . Number of Children: 2  . Years of Education: Associate   Occupational History  . Curator    Social History Main Topics  . Smoking status: Never Smoker   . Smokeless tobacco: Never Used  . Alcohol Use: No  . Drug Use: No  . Sexual Activity:    Partners: Female   Other Topics Concern  . Not on file   Social History Narrative   Lives with wife who is in a wheelchair, has to drive her ; she still works    Pt has his own job        Medication List       This list is accurate as of: 12/10/15 11:59 PM.  Always use your most recent med list.               CAMBIA 50 MG Pack  Generic drug:  Diclofenac Potassium  Take 1 packet by mouth as needed.     omeprazole 40 MG capsule  Commonly known as:  PRILOSEC  Take 1 capsule (40 mg total) by mouth daily.     sertraline 100 MG tablet  Commonly known as:  ZOLOFT  Take 1.5 tablets (150 mg total) by mouth daily.     zolmitriptan 5 MG tablet  Commonly known as:  ZOMIG  Take 5 mg by mouth as needed.           Objective:   Physical Exam BP 122/84 mmHg  Pulse 82  Temp(Src) 98.4 F (36.9 C) (Oral)  Ht  (1.803 m)  Wt 194 lb (87.998 kg)  BMI 27.07 kg/m2  SpO2 95% General:   Well developed, well nourished . NAD.  HEENT:  Normocephalic . Face symmetric, atraumatic Lungs:  CTA B Normal respiratory effort, no intercostal retractions, no accessory muscle use. Heart: RRR,  no murmur.  no pretibial edema bilaterally  Abdomen:  Not distended, soft, non-tender. No rebound or rigidity.  Skin: Not pale. Not jaundice Neurologic:  alert & oriented X3.  Speech normal, gait appropriate for age and unassisted Psych--  Cognition and judgment appear intact.  Cooperative with normal attention span and concentration.  Behavior appropriate. No anxious or depressed appearing.    Assessment & Plan:   Assessment > Anxiety GERD Migraines, f/u neuro stuttering  Snoring --sleep study 3, all  Negative,  last 2011 BPH: LUTS sx after cscope 05-2015 (06-2015: nl DRE, UA, UDX neg, PSA wnl; flomax intolerant)  Plan:  Anxiety: Much improved with sertraline without apparent side effects BPH, LUTS: Cont w/ vague sx "since the colonoscopy" 05-2015; w/u done  06-2015 negative. Refer to urology IBS? Con w/ vague sx , today states since the colonoscopy. No anemia, etiology unclear, IBS? Reassess on RTC RTC 6 months

## 2015-12-11 NOTE — Assessment & Plan Note (Signed)
Anxiety: Much improved with sertraline without apparent side effects BPH, LUTS: Cont w/ vague sx "since the colonoscopy" 05-2015; w/u done  06-2015 negative. Refer to urology IBS? Con w/ vague sx , today states since the colonoscopy. No anemia, etiology unclear, IBS? Reassess on RTC RTC 6 months

## 2016-03-21 IMAGING — CR DG LUMBAR SPINE COMPLETE 4+V
5 series · 5 of 5 positions shown · non-contrast
Comparison: None.

CLINICAL DATA: Low back pain.

EXAM:
LUMBAR SPINE - COMPLETE 4+ VIEW

[AP]
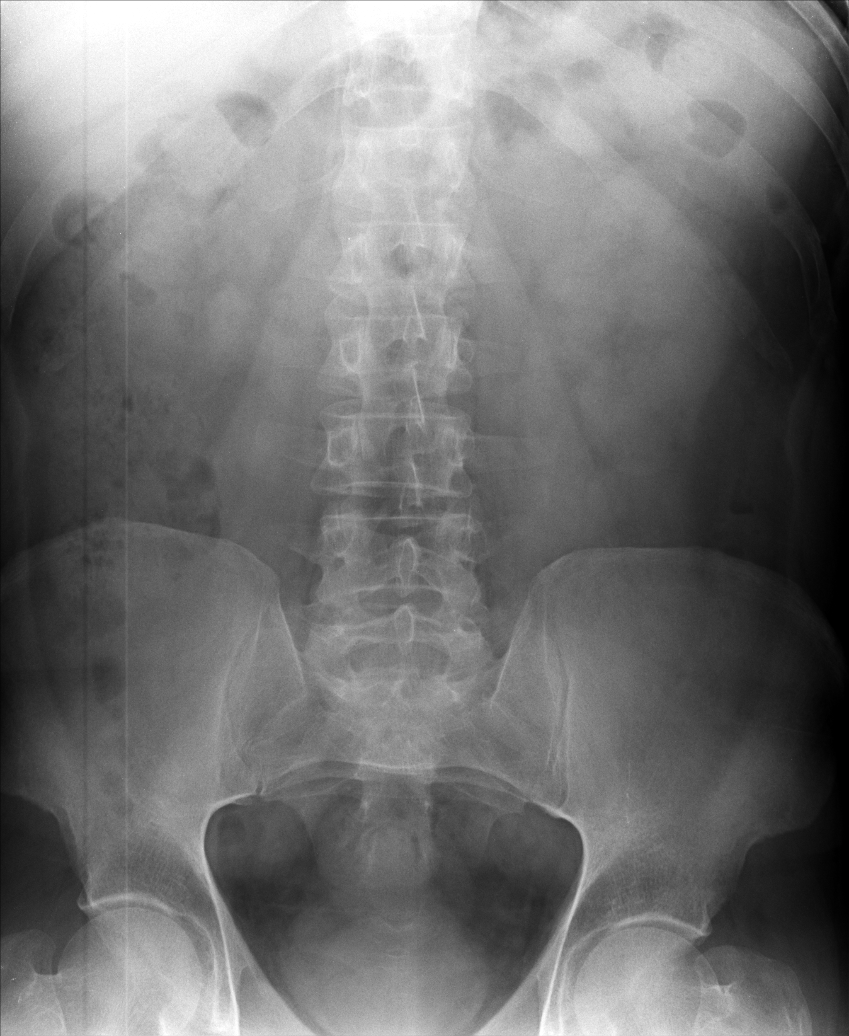

[rpo]
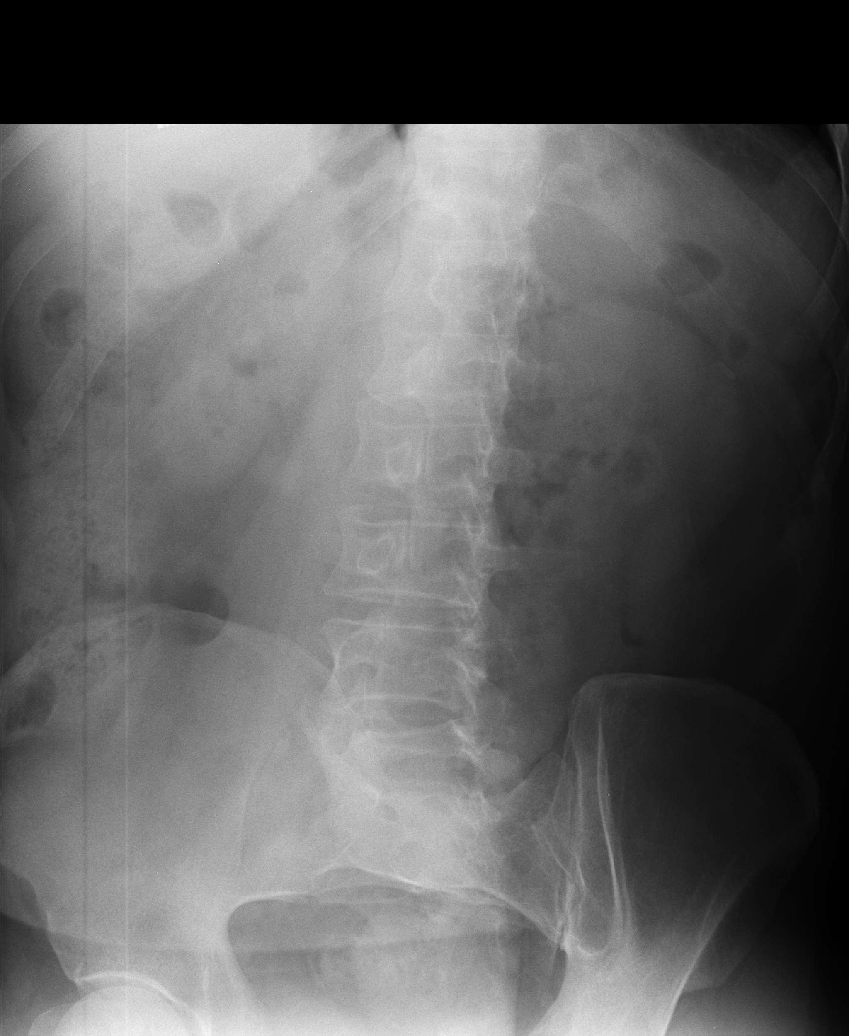

[lpo]
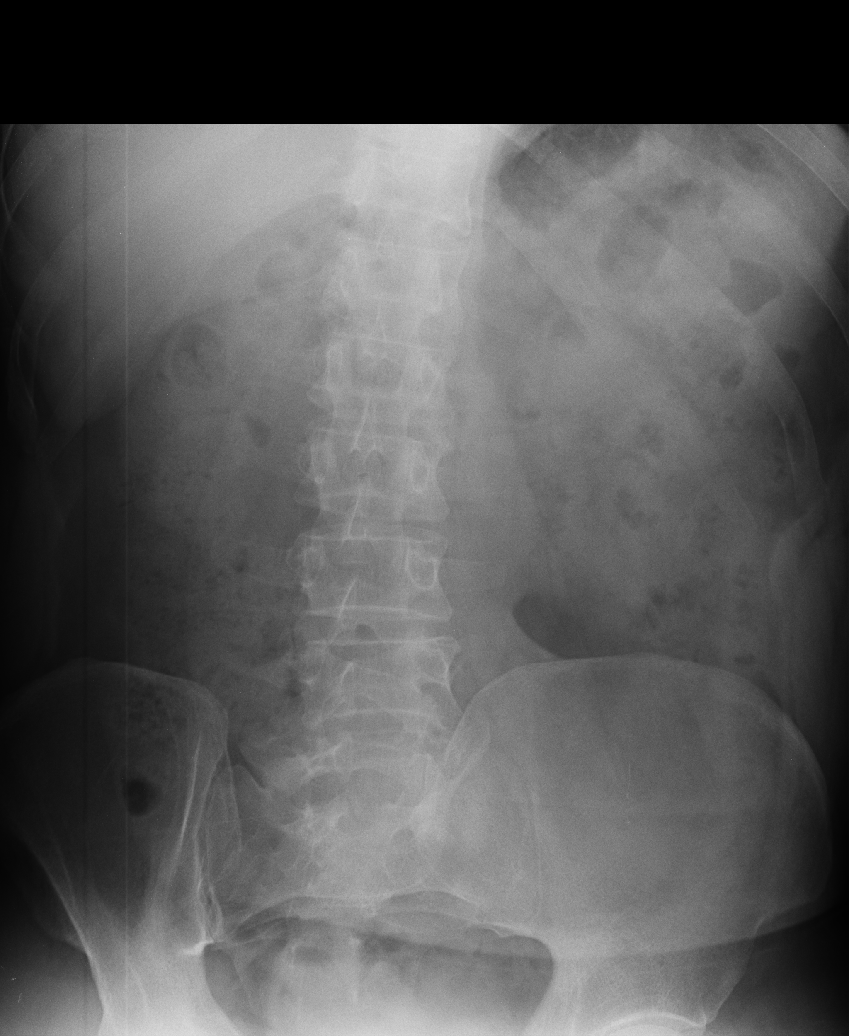

[lateral]
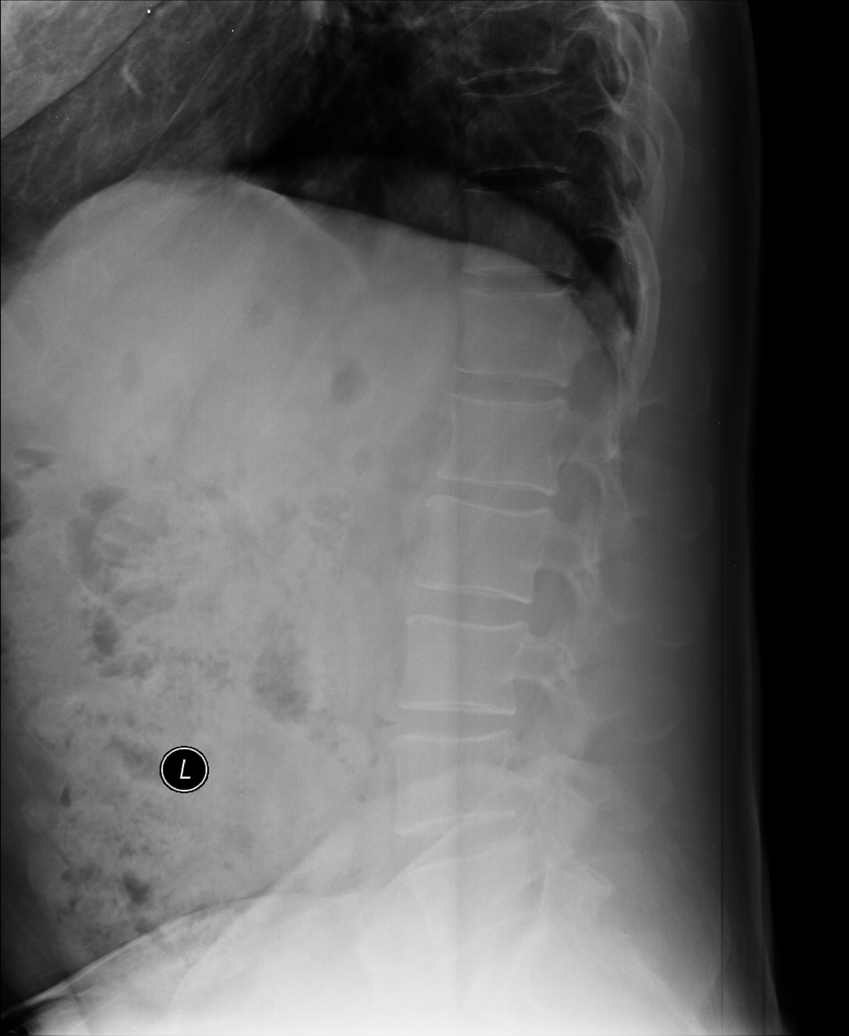

[l5 s1]
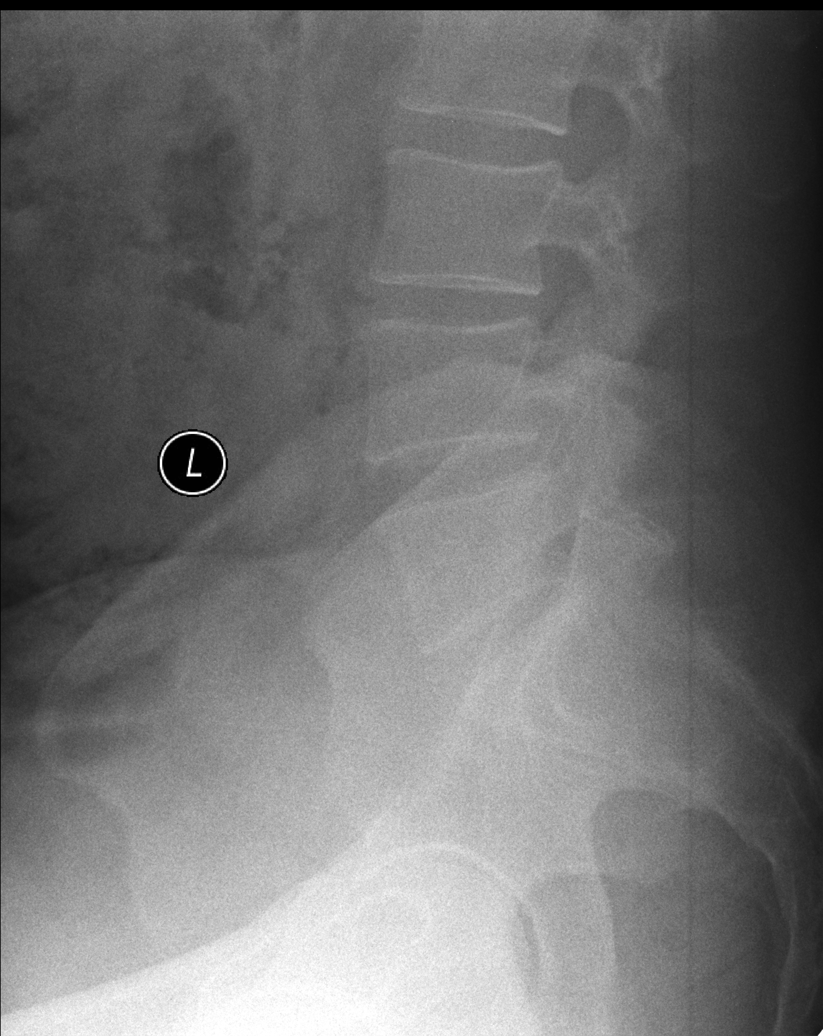

[5 of 5 positions shown; findings below may reference images not displayed]

FINDINGS: There is no evidence of lumbar spine fracture. Alignment is normal.
Mild disc space narrowing and ventral endplate spurring is noted at
L1-2.
IMPRESSION: 1. Mild osteoarthritis at L1-2.

## 2016-03-30 ENCOUNTER — Encounter: Payer: Self-pay | Admitting: Internal Medicine

## 2016-05-11 ENCOUNTER — Ambulatory Visit (INDEPENDENT_AMBULATORY_CARE_PROVIDER_SITE_OTHER): Payer: 59 | Admitting: Internal Medicine

## 2016-05-11 ENCOUNTER — Encounter: Payer: Self-pay | Admitting: Internal Medicine

## 2016-05-11 VITALS — BP 108/68 | HR 76 | Temp 98.0°F | Resp 12 | Ht 71.0 in | Wt 200.2 lb

## 2016-05-11 DIAGNOSIS — Z0000 Encounter for general adult medical examination without abnormal findings: Secondary | ICD-10-CM | POA: Diagnosis not present

## 2016-05-11 MED ORDER — SERTRALINE HCL 100 MG PO TABS: 150.0000 mg | ORAL_TABLET | Freq: Every day | ORAL | 3 refills | Status: DC

## 2016-05-11 MED ORDER — OMEPRAZOLE 40 MG PO CPDR: 40.0000 mg | DELAYED_RELEASE_CAPSULE | Freq: Every day | ORAL | 3 refills | Status: DC

## 2016-05-11 NOTE — Assessment & Plan Note (Signed)
Anxiety: Well-controlled BPH: On 2 medications, sx controlled at this time Back pain: Has occ problems, recommend to call if sx increase RTC one year, CPX

## 2016-05-11 NOTE — Progress Notes (Signed)
Subjective:    Patient ID: Daniel Petersen, male    DOB: [birth year], [age] y.o.   MRN: [id number]  DOS:  05/11/2016 Type of visit - description : CPX Interval history:  feeling well. No major concerns     Review of Systems  Constitutional: No fever. No chills. No unexplained wt changes. No unusual sweats  HEENT: No dental problems, no ear discharge, no facial swelling, no voice changes. No eye discharge, no eye  redness , no  intolerance to light   Respiratory: No wheezing , no  difficulty breathing. No cough , no mucus production  Cardiovascular: No CP, no leg swelling , no  Palpitations  GI: no nausea, no vomiting, no diarrhea , no  abdominal pain.  No blood in the stools. No dysphagia, no odynophagia    Endocrine: No polyphagia, no polyuria , no polydipsia  GU: BPH symptoms well controlled with current medications     Skin: No change in the color of the skin, palor , no rash Musculoskeletal:  Occasional back pain with yard work, at baseline   Allergic, immunologic: No environmental allergies , no  food allergies  Neurological: No dizziness no  syncope. No headaches. No diplopia, no slurred, no slurred speech, no motor deficits, no facial  Numbness  Hematological: No enlarged lymph nodes, no easy bruising , no unusual bleedings  Psychiatry: No suicidal ideas, no hallucinations, no beavior problems, no confusion.  No unusual/severe anxiety, no depression (symptoms controlled with medication)   Past Medical History:  Diagnosis Date  . Anxiety   . BPH (benign prostatic hyperplasia)   . Enlarged prostate on rectal examination 2017  . Migraine    f/u by neuro  . Snoring    s/p 3 sleep study: all neg , last 2011  . Stuttering   . Urinary urgency 2017    Past Surgical History:  Procedure Laterality Date  . NASAL SEPTUM SURGERY  2010    Social History   Social History  . Marital status: Married    Spouse name: Larita FifeLynn  . Number of children: 2  . Years of  education: Associate   Occupational History  . not working at present    Social History Main Topics  . Smoking status: Never Smoker  . Smokeless tobacco: Never Used  . Alcohol use No  . Drug use: No  . Sexual activity: Yes    Partners: Female   Other Topics Concern  . Not on file   Social History Narrative   Lives with wife who is in a wheelchair, has to drive her ; she still works     2 adult children     Family History  Problem Relation Age of Onset  . Stroke Mother     M late 5450s  . Diabetes Maternal Grandmother   . Colon cancer Neg Hx   . Prostate cancer Neg Hx   . CAD Neg Hx       Medication List       Accurate as of 05/11/16  5:35 PM. Always use your most recent med list.          CAMBIA 50 MG Pack Generic drug:  Diclofenac Potassium Take 1 packet by mouth as needed.   finasteride 5 MG tablet Commonly known as:  PROSCAR Take 5 mg by mouth daily.   omeprazole 40 MG capsule Commonly known as:  PRILOSEC Take 1 capsule (40 mg total) by mouth daily.   sertraline 100 MG tablet Commonly known  as:  ZOLOFT Take 1.5 tablets (150 mg total) by mouth daily.   tamsulosin 0.4 MG Caps capsule Commonly known as:  FLOMAX Take 0.4 mg by mouth daily.   zolmitriptan 5 MG tablet Commonly known as:  ZOMIG Take 5 mg by mouth as needed.          Objective:   Physical Exam BP 108/68 (BP Location: Left Arm, Patient Position: Sitting, Cuff Size: Normal)   Pulse 76   Temp 98 F (36.7 C) (Oral)   Resp 12   Ht 5\' 11"  (1.803 m)   Wt 200 lb 4 oz (90.8 kg)   SpO2 98%   BMI 27.93 kg/m   General:   Well developed, well nourished . NAD.  Neck: No  thyromegaly  HEENT:  Normocephalic . Face symmetric, atraumatic Lungs:  CTA B Normal respiratory effort, no intercostal retractions, no accessory muscle use. Heart: RRR,  no murmur.  No pretibial edema bilaterally  Abdomen:  Not distended, soft, non-tender. No rebound or rigidity.   Skin: Exposed areas without  rash. Not pale. Not jaundice Neurologic:  alert & oriented X3.  Speech normal, gait appropriate for age and unassisted Strength symmetric and appropriate for age.  Psych: Cognition and judgment appear intact.  Cooperative with normal attention span and concentration.  Behavior appropriate. No anxious or depressed appearing.    Assessment & Plan:   Assessment > Anxiety GERD Migraines, f/u neuro stuttering  Snoring --sleep study 3, all  Negative,  last 2011 BPH: LUTS sx after cscope 05-2015 (06-2015: nl DRE, UA, UDX neg, PSA wnl; flomax intolerant); urology eval 2017, had a CT-- "massive gland" Back pain-- seen elsewhere 2016 , had a MRI  PLAN: Anxiety: Well-controlled BPH: On 2 medications, sx controlled at this time Back pain: Has occ problems, recommend to call if sx increase RTC one year, CPX

## 2016-05-11 NOTE — Patient Instructions (Signed)
  GO TO THE FRONT DESK Schedule your next appointment for a  Physical exam in 1 year  Schedule labs to be done this week, fasting  

## 2016-05-11 NOTE — Progress Notes (Signed)
Pre visit review using our clinic review tool, if applicable. No additional management support is needed unless otherwise documented below in the visit note. 

## 2016-05-11 NOTE — Assessment & Plan Note (Addendum)
(  CPX early, patient aware insurance may not covered the services) Td 09 per pt.  Declined a flu shot  CCS: Cscope 05-2015, next per GI prostate ca screening: see comments under BPH , PSA per urology Diet-exercise discussed Labs : BMP, FLP, CBC. Will come back fasting.

## 2016-05-12 ENCOUNTER — Other Ambulatory Visit (INDEPENDENT_AMBULATORY_CARE_PROVIDER_SITE_OTHER): Payer: 59

## 2016-05-12 ENCOUNTER — Telehealth: Payer: Self-pay

## 2016-05-12 DIAGNOSIS — Z0000 Encounter for general adult medical examination without abnormal findings: Secondary | ICD-10-CM

## 2016-05-12 LAB — CBC WITH DIFFERENTIAL/PLATELET
BASOS PCT: 0.6 % (ref 0.0–3.0)
Basophils Absolute: 0 10*3/uL (ref 0.0–0.1)
EOS ABS: 0.2 10*3/uL (ref 0.0–0.7)
Eosinophils Relative: 2.9 % (ref 0.0–5.0)
HCT: 43.1 % (ref 39.0–52.0)
HEMOGLOBIN: 14.9 g/dL (ref 13.0–17.0)
Lymphocytes Relative: 31.2 % (ref 12.0–46.0)
Lymphs Abs: 2.2 10*3/uL (ref 0.7–4.0)
MCHC: 34.5 g/dL (ref 30.0–36.0)
MCV: 87.9 fl (ref 78.0–100.0)
MONO ABS: 0.5 10*3/uL (ref 0.1–1.0)
Monocytes Relative: 7 % (ref 3.0–12.0)
NEUTROS ABS: 4.2 10*3/uL (ref 1.4–7.7)
Neutrophils Relative %: 58.3 % (ref 43.0–77.0)
PLATELETS: 273 10*3/uL (ref 150.0–400.0)
RBC: 4.91 Mil/uL (ref 4.22–5.81)
RDW: 13.7 % (ref 11.5–15.5)
WBC: 7.2 10*3/uL (ref 4.0–10.5)

## 2016-05-12 LAB — LIPID PANEL
CHOL/HDL RATIO: 3
CHOLESTEROL: 170 mg/dL (ref 0–200)
HDL: 50.7 mg/dL (ref >39.00)
LDL Cholesterol: 91 mg/dL (ref 0–99)
NonHDL: 118.95
TRIGLYCERIDES: 142 mg/dL (ref 0.0–149.0)
VLDL: 28.4 mg/dL (ref 0.0–40.0)

## 2016-05-12 LAB — BASIC METABOLIC PANEL
BUN: 25 mg/dL — AB (ref 6–23)
CALCIUM: 9.2 mg/dL (ref 8.4–10.5)
CO2: 28 mEq/L (ref 19–32)
Chloride: 104 mEq/L (ref 96–112)
Creatinine, Ser: 1.05 mg/dL (ref 0.40–1.50)
GFR: 78.03 mL/min (ref >60.00)
GLUCOSE: 91 mg/dL (ref 70–99)
Potassium: 4.5 mEq/L (ref 3.5–5.1)
Sodium: 138 mEq/L (ref 135–145)

## 2016-05-12 NOTE — Telephone Encounter (Signed)
Form completed, signed by PCP and [facility] at [phone]. Original form mailed back to Pt and copy sent for scanning.

## 2016-06-10 ENCOUNTER — Ambulatory Visit: Payer: 59 | Admitting: Internal Medicine

## 2017-01-14 DIAGNOSIS — M950 Acquired deformity of nose: Secondary | ICD-10-CM | POA: Diagnosis not present

## 2017-01-14 DIAGNOSIS — J3489 Other specified disorders of nose and nasal sinuses: Secondary | ICD-10-CM | POA: Diagnosis not present

## 2017-01-14 DIAGNOSIS — R0683 Snoring: Secondary | ICD-10-CM | POA: Diagnosis not present

## 2017-01-19 DIAGNOSIS — M25561 Pain in right knee: Secondary | ICD-10-CM | POA: Diagnosis not present

## 2017-01-19 DIAGNOSIS — M25562 Pain in left knee: Secondary | ICD-10-CM | POA: Diagnosis not present

## 2017-04-01 DIAGNOSIS — M799 Soft tissue disorder, unspecified: Secondary | ICD-10-CM | POA: Diagnosis not present

## 2017-04-15 ENCOUNTER — Other Ambulatory Visit: Payer: Self-pay | Admitting: Surgery

## 2017-04-16 ENCOUNTER — Other Ambulatory Visit: Payer: Self-pay | Admitting: Internal Medicine

## 2017-04-18 ENCOUNTER — Ambulatory Visit: Payer: Self-pay | Admitting: Surgery

## 2017-04-26 ENCOUNTER — Encounter (HOSPITAL_BASED_OUTPATIENT_CLINIC_OR_DEPARTMENT_OTHER): Payer: Self-pay | Admitting: *Deleted

## 2017-04-26 DIAGNOSIS — M7989 Other specified soft tissue disorders: Secondary | ICD-10-CM | POA: Diagnosis present

## 2017-04-26 NOTE — H&P (Signed)
General Surgery [facility]  Daniel Petersen DOB: [birth year] Married / Language: English / Race: White Male  History of Present Illness  The patient is a 56 year old male who presents with a pilonidal cyst.  CC: mass in natal cleft  Patient is referred by Dr. Bufford ButtnerSusan Stinehelfer from dermatology for evaluation and management of suspected pilonidal cyst. Patient notes that he has had a slowly enlarging mass in the upper natal cleft for approximately 3-4 years. He denies any history of infection. He denies any drainage. He has had no prior surgical intervention. Patient does note some mild discomfort particularly with physical activity. He does note that the lesion is becoming larger. Patient was evaluated by dermatology and referred to surgery for definitive management. Patient has had no other such lesions removed.   Past Surgical History No pertinent past surgical history   Diagnostic Studies History  Colonoscopy  1-5 years ago  Allergies  No Known Allergies 04/01/2017 Allergies Reconciled   Medication History  Finasteride (5MG  Tablet, Oral) Active. Omeprazole (40MG  Capsule DR, Oral) Active. Sertraline HCl (100MG  Tablet, Oral) Active. Tamsulosin HCl (0.4MG  Capsule, Oral) Active. Zomig (5MG  Tablet, Oral) Active. Cambia (50MG  Packet, Oral) Active. Medications Reconciled  Social History  Caffeine use  Carbonated beverages, Tea. Illicit drug use  Remotely quit drug use. No alcohol use  Tobacco use  Never smoker.  Family History Cerebrovascular Accident  Mother. Depression  Mother. Kidney Disease  Father. Respiratory Condition  Father. Seizure disorder  Sister.  Other Problems  Enlarged Prostate  Gastroesophageal Reflux Disease    Review of Systems  General Not Present- Appetite Loss, Chills, Fatigue, Fever, Night Sweats, Weight Gain and Weight Loss. Skin Not Present- Change in Wart/Mole, Dryness, Hives, Jaundice, New  Lesions, Non-Healing Wounds, Rash and Ulcer. HEENT Present- Wears glasses/contact lenses. Not Present- Earache, Hearing Loss, Hoarseness, Nose Bleed, Oral Ulcers, Ringing in the Ears, Seasonal Allergies, Sinus Pain, Sore Throat, Visual Disturbances and Yellow Eyes. Respiratory Not Present- Bloody sputum, Chronic Cough, Difficulty Breathing, Snoring and Wheezing. Breast Not Present- Breast Mass, Breast Pain, Nipple Discharge and Skin Changes. Cardiovascular Not Present- Chest Pain, Difficulty Breathing Lying Down, Leg Cramps, Palpitations, Rapid Heart Rate, Shortness of Breath and Swelling of Extremities. Gastrointestinal Not Present- Abdominal Pain, Bloating, Bloody Stool, Change in Bowel Habits, Chronic diarrhea, Constipation, Difficulty Swallowing, Excessive gas, Gets full quickly at meals, Hemorrhoids, Indigestion, Nausea, Rectal Pain and Vomiting. Male Genitourinary Present- Frequency and Urgency. Not Present- Blood in Urine, Change in Urinary Stream, Impotence, Nocturia, Painful Urination and Urine Leakage. Musculoskeletal Not Present- Back Pain, Joint Pain, Joint Stiffness, Muscle Pain, Muscle Weakness and Swelling of Extremities. Neurological Not Present- Decreased Memory, Fainting, Headaches, Numbness, Seizures, Tingling, Tremor, Trouble walking and Weakness. Psychiatric Not Present- Anxiety, Bipolar, Change in Sleep Pattern, Depression, Fearful and Frequent crying. Endocrine Not Present- Cold Intolerance, Excessive Hunger, Hair Changes, Heat Intolerance, Hot flashes and New Diabetes. Hematology Not Present- Blood Thinners, Easy Bruising, Excessive bleeding, Gland problems, HIV and Persistent Infections.  Vitals  Weight: 193 lb Height: 71in Body Surface Area: 2.08 m Body Mass Index: 26.92 kg/m  Temp.: 97.34F(Oral)  Pulse: 73 (Regular)  BP: 126/78 (Sitting, Left Arm, Standard)  Physical Exam  The physical exam findings are as follows: Note:CONSTITUTIONAL See vital signs  recorded above  GENERAL APPEARANCE Development: normal Nutritional status: normal Gross deformities: none  SKIN Rash, lesions, ulcers: none Induration, erythema: none Nodules: none palpable  EYES Conjunctiva and lids: normal Pupils: equal and reactive Iris: normal bilaterally  EARS, NOSE, MOUTH, THROAT External ears: no lesion or deformity External nose: no lesion or deformity Hearing: grossly normal Lips: no lesion or deformity Dentition: normal for age Oral mucosa: moist  NECK Symmetric: yes Trachea: midline Thyroid: no palpable nodules in the thyroid bed  CHEST Respiratory effort: normal Retraction or accessory muscle use: no Breath sounds: normal bilaterally Rales, rhonchi, wheeze: none  CARDIOVASCULAR Auscultation: regular rhythm, normal rate Murmurs: none Pulses: carotid and radial pulse 2+ palpable Lower extremity edema: none Lower extremity varicosities: none  GENITOURINARY In the upper natal cleft is a palpable subcutaneous mass measuring approximately 3.0 x 1.5 x 1.5 cm. This is discrete, mobile, and minimally tender. There appears to be a single sinus tract attaching to the skin. There is no fluctuance and no drainage. In the lower natal cleft there are no signs of fistula or sinus tract.  MUSCULOSKELETAL Station and gait: normal Digits and nails: no clubbing or cyanosis Muscle strength: grossly normal all extremities Range of motion: grossly normal all extremities Deformity: none  LYMPHATIC Cervical: none palpable Supraclavicular: none palpable  PSYCHIATRIC Oriented to person, place, and time: yes Mood and affect: normal for situation Judgment and insight: appropriate for situation   Assessment & Plan  SOFT TISSUE MASS (M79.9)  Patient is referred by his dermatologist for evaluation of a soft tissue mass in the upper natal cleft to rule out pilonidal cyst.  Patient is provided with written literature on pilonidal disease. We discussed  other possible causes of this mass.  Patient has a 3 cm subcutaneous mass in the upper natal cleft. He has no other signs of pilonidal disease. His history is not compatible with pilonidal disease. Patient likely has an epidermal inclusion cyst or sebaceous cyst which happens to involve the upper natal cleft. However, I cannot completely rule out pilonidal disease. Patient would like to proceed with surgical excision for management and definitive diagnosis. We will make arrangements for outpatient surgery at a time convenient for him in the near future. We discussed risk and benefits of the procedure. We discussed the likelihood that I will use standard nylon sutures in the skin which will need to be removed here in the office 7-10 days following his procedure. I've asked the patient to arrange for a few days out of work following his procedure. He understands and again wishes to proceed with surgery in the near future.  The risks and benefits of the procedure have been discussed at length with the patient. The patient understands the proposed procedure, potential alternative treatments, and the course of recovery to be expected. All of the patient's questions have been answered at this time. The patient wishes to proceed with surgery.  Velora Heckler, MD, [facility] Office: [phone]

## 2017-04-29 ENCOUNTER — Ambulatory Visit (HOSPITAL_BASED_OUTPATIENT_CLINIC_OR_DEPARTMENT_OTHER): Payer: 59 | Admitting: Anesthesiology

## 2017-04-29 ENCOUNTER — Ambulatory Visit (HOSPITAL_BASED_OUTPATIENT_CLINIC_OR_DEPARTMENT_OTHER)
Admission: RE | Admit: 2017-04-29 | Discharge: 2017-04-29 | Disposition: A | Discharge status: Home / Self Care | Payer: 59 | Source: Ambulatory Visit | Attending: Surgery | Admitting: Surgery

## 2017-04-29 ENCOUNTER — Encounter (HOSPITAL_BASED_OUTPATIENT_CLINIC_OR_DEPARTMENT_OTHER)
Admission: RE | Disposition: A | Discharge status: Home / Self Care | Payer: Self-pay | Source: Ambulatory Visit | Attending: Surgery

## 2017-04-29 ENCOUNTER — Encounter (HOSPITAL_BASED_OUTPATIENT_CLINIC_OR_DEPARTMENT_OTHER): Payer: Self-pay | Admitting: Anesthesiology

## 2017-04-29 DIAGNOSIS — M7989 Other specified soft tissue disorders: Secondary | ICD-10-CM | POA: Diagnosis present

## 2017-04-29 DIAGNOSIS — F419 Anxiety disorder, unspecified: Secondary | ICD-10-CM | POA: Diagnosis not present

## 2017-04-29 DIAGNOSIS — R51 Headache: Secondary | ICD-10-CM | POA: Diagnosis not present

## 2017-04-29 DIAGNOSIS — N400 Enlarged prostate without lower urinary tract symptoms: Secondary | ICD-10-CM | POA: Insufficient documentation

## 2017-04-29 DIAGNOSIS — K219 Gastro-esophageal reflux disease without esophagitis: Secondary | ICD-10-CM | POA: Insufficient documentation

## 2017-04-29 DIAGNOSIS — Z823 Family history of stroke: Secondary | ICD-10-CM | POA: Diagnosis not present

## 2017-04-29 DIAGNOSIS — L0591 Pilonidal cyst without abscess: Secondary | ICD-10-CM | POA: Diagnosis present

## 2017-04-29 DIAGNOSIS — Z841 Family history of disorders of kidney and ureter: Secondary | ICD-10-CM | POA: Insufficient documentation

## 2017-04-29 DIAGNOSIS — Z820 Family history of epilepsy and other diseases of the nervous system: Secondary | ICD-10-CM | POA: Diagnosis not present

## 2017-04-29 DIAGNOSIS — L720 Epidermal cyst: Secondary | ICD-10-CM | POA: Insufficient documentation

## 2017-04-29 DIAGNOSIS — L723 Sebaceous cyst: Secondary | ICD-10-CM | POA: Diagnosis not present

## 2017-04-29 DIAGNOSIS — Z836 Family history of other diseases of the respiratory system: Secondary | ICD-10-CM | POA: Insufficient documentation

## 2017-04-29 DIAGNOSIS — G43909 Migraine, unspecified, not intractable, without status migrainosus: Secondary | ICD-10-CM | POA: Diagnosis not present

## 2017-04-29 DIAGNOSIS — Z818 Family history of other mental and behavioral disorders: Secondary | ICD-10-CM | POA: Insufficient documentation

## 2017-04-29 DIAGNOSIS — Z79899 Other long term (current) drug therapy: Secondary | ICD-10-CM | POA: Diagnosis not present

## 2017-04-29 DIAGNOSIS — R222 Localized swelling, mass and lump, trunk: Secondary | ICD-10-CM | POA: Diagnosis not present

## 2017-04-29 HISTORY — PX: MASS EXCISION: SHX2000

## 2017-04-29 HISTORY — DX: Gastro-esophageal reflux disease without esophagitis: K21.9

## 2017-04-29 SURGERY — EXCISION MASS
Anesthesia: General | Site: Back

## 2017-04-29 MED ORDER — MIDAZOLAM HCL 2 MG/2ML IJ SOLN
INTRAMUSCULAR | Status: AC
  Filled 2017-04-29: qty 2

## 2017-04-29 MED ORDER — CHLORHEXIDINE GLUCONATE CLOTH 2 % EX PADS: 6.0000 | MEDICATED_PAD | Freq: Once | CUTANEOUS | Status: DC

## 2017-04-29 MED ORDER — OXYCODONE HCL 5 MG/5ML PO SOLN: 5.0000 mg | Freq: Once | ORAL | Status: DC | PRN

## 2017-04-29 MED ORDER — LACTATED RINGERS IV SOLN: INTRAVENOUS | Status: DC

## 2017-04-29 MED ORDER — BACITRACIN 500 UNIT/GM EX OINT
TOPICAL_OINTMENT | CUTANEOUS | Status: DC | PRN
  Administered 2017-04-29: 1 via TOPICAL

## 2017-04-29 MED ORDER — FENTANYL CITRATE (PF) 100 MCG/2ML IJ SOLN
INTRAMUSCULAR | Status: AC
  Filled 2017-04-29: qty 2

## 2017-04-29 MED ORDER — BUPIVACAINE HCL (PF) 0.5 % IJ SOLN
INTRAMUSCULAR | Status: AC
  Filled 2017-04-29: qty 30

## 2017-04-29 MED ORDER — HYDROCODONE-ACETAMINOPHEN 5-325 MG PO TABS: 1.0000 | ORAL_TABLET | ORAL | 0 refills | Status: DC | PRN

## 2017-04-29 MED ORDER — BUPIVACAINE-EPINEPHRINE 0.5% -1:200000 IJ SOLN
INTRAMUSCULAR | Status: DC | PRN
  Administered 2017-04-29: 10 mL

## 2017-04-29 MED ORDER — LIDOCAINE 2% (20 MG/ML) 5 ML SYRINGE
INTRAMUSCULAR | Status: DC | PRN
  Administered 2017-04-29: 60 mg via INTRAVENOUS

## 2017-04-29 MED ORDER — SCOPOLAMINE 1 MG/3DAYS TD PT72: 1.0000 | MEDICATED_PATCH | Freq: Once | TRANSDERMAL | Status: DC | PRN

## 2017-04-29 MED ORDER — PROMETHAZINE HCL 25 MG/ML IJ SOLN: 6.2500 mg | INTRAMUSCULAR | Status: DC | PRN

## 2017-04-29 MED ORDER — ONDANSETRON HCL 4 MG/2ML IJ SOLN
INTRAMUSCULAR | Status: DC | PRN
  Administered 2017-04-29: 4 mg via INTRAVENOUS

## 2017-04-29 MED ORDER — MEPERIDINE HCL 25 MG/ML IJ SOLN: 6.2500 mg | INTRAMUSCULAR | Status: DC | PRN

## 2017-04-29 MED ORDER — DEXAMETHASONE SODIUM PHOSPHATE 10 MG/ML IJ SOLN
INTRAMUSCULAR | Status: AC
  Filled 2017-04-29: qty 1

## 2017-04-29 MED ORDER — EPHEDRINE SULFATE-NACL 50-0.9 MG/10ML-% IV SOSY
PREFILLED_SYRINGE | INTRAVENOUS | Status: DC | PRN
  Administered 2017-04-29: 10 mg via INTRAVENOUS

## 2017-04-29 MED ORDER — DEXAMETHASONE SODIUM PHOSPHATE 4 MG/ML IJ SOLN
INTRAMUSCULAR | Status: DC | PRN
  Administered 2017-04-29: 10 mg via INTRAVENOUS

## 2017-04-29 MED ORDER — LIDOCAINE 2% (20 MG/ML) 5 ML SYRINGE
INTRAMUSCULAR | Status: AC
  Filled 2017-04-29: qty 5

## 2017-04-29 MED ORDER — 0.9 % SODIUM CHLORIDE (POUR BTL) OPTIME
TOPICAL | Status: DC | PRN
  Administered 2017-04-29: 1000 mL

## 2017-04-29 MED ORDER — BUPIVACAINE-EPINEPHRINE (PF) 0.5% -1:200000 IJ SOLN
INTRAMUSCULAR | Status: AC
  Filled 2017-04-29: qty 30

## 2017-04-29 MED ORDER — LACTATED RINGERS IV SOLN
INTRAVENOUS | Status: DC
  Administered 2017-04-29 (×2): via INTRAVENOUS

## 2017-04-29 MED ORDER — CEFAZOLIN SODIUM-DEXTROSE 2-4 GM/100ML-% IV SOLN
2.0000 g | INTRAVENOUS | Status: AC
  Administered 2017-04-29: 2 g via INTRAVENOUS

## 2017-04-29 MED ORDER — HYDROMORPHONE HCL 1 MG/ML IJ SOLN: 0.2500 mg | INTRAMUSCULAR | Status: DC | PRN

## 2017-04-29 MED ORDER — FENTANYL CITRATE (PF) 100 MCG/2ML IJ SOLN
50.0000 ug | INTRAMUSCULAR | Status: DC | PRN
  Administered 2017-04-29: 100 ug via INTRAVENOUS

## 2017-04-29 MED ORDER — ONDANSETRON HCL 4 MG/2ML IJ SOLN
INTRAMUSCULAR | Status: AC
  Filled 2017-04-29: qty 2

## 2017-04-29 MED ORDER — PROPOFOL 10 MG/ML IV BOLUS
INTRAVENOUS | Status: DC | PRN
  Administered 2017-04-29: 150 mg via INTRAVENOUS

## 2017-04-29 MED ORDER — CEFAZOLIN SODIUM-DEXTROSE 2-4 GM/100ML-% IV SOLN
INTRAVENOUS | Status: AC
  Filled 2017-04-29: qty 100

## 2017-04-29 MED ORDER — BACITRACIN ZINC 500 UNIT/GM EX OINT
TOPICAL_OINTMENT | CUTANEOUS | Status: AC
  Filled 2017-04-29: qty 0.9

## 2017-04-29 MED ORDER — OXYCODONE HCL 5 MG PO TABS: 5.0000 mg | ORAL_TABLET | Freq: Once | ORAL | Status: DC | PRN

## 2017-04-29 MED ORDER — MIDAZOLAM HCL 2 MG/2ML IJ SOLN
1.0000 mg | INTRAMUSCULAR | Status: DC | PRN
  Administered 2017-04-29: 2 mg via INTRAVENOUS

## 2017-04-29 SURGICAL SUPPLY — 35 items
BLADE SURG 15 STRL LF DISP TIS (BLADE) ×1 IMPLANT
BLADE SURG 15 STRL SS (BLADE) ×3
CLEANER CAUTERY TIP 5X5 PAD (MISCELLANEOUS) IMPLANT
CLOSURE WOUND 1/2 X4 (GAUZE/BANDAGES/DRESSINGS)
COVER BACK TABLE 60X90IN (DRAPES) ×3 IMPLANT
COVER MAYO STAND STRL (DRAPES) ×3 IMPLANT
DRAPE LAPAROTOMY 100X72 PEDS (DRAPES) ×2 IMPLANT
DRAPE UTILITY XL STRL (DRAPES) ×3 IMPLANT
DRSG TEGADERM 4X4.75 (GAUZE/BANDAGES/DRESSINGS) IMPLANT
ELECT REM PT RETURN 9FT ADLT (ELECTROSURGICAL) ×3
ELECTRODE REM PT RTRN 9FT ADLT (ELECTROSURGICAL) ×1 IMPLANT
GAUZE SPONGE 4X4 12PLY STRL (GAUZE/BANDAGES/DRESSINGS) ×2 IMPLANT
GAUZE SPONGE 4X4 12PLY STRL LF (GAUZE/BANDAGES/DRESSINGS) ×3 IMPLANT
GLOVE SURG ORTHO 8.0 STRL STRW (GLOVE) ×3 IMPLANT
GOWN STRL REUS W/ TWL LRG LVL3 (GOWN DISPOSABLE) ×1 IMPLANT
GOWN STRL REUS W/ TWL XL LVL3 (GOWN DISPOSABLE) ×1 IMPLANT
GOWN STRL REUS W/TWL LRG LVL3 (GOWN DISPOSABLE) ×3
GOWN STRL REUS W/TWL XL LVL3 (GOWN DISPOSABLE) ×3
NDL HYPO 25X1 1.5 SAFETY (NEEDLE) ×1 IMPLANT
NEEDLE HYPO 25X1 1.5 SAFETY (NEEDLE) ×3 IMPLANT
PACK BASIN DAY SURGERY FS (CUSTOM PROCEDURE TRAY) ×3 IMPLANT
PAD CLEANER CAUTERY TIP 5X5 (MISCELLANEOUS) ×2
PENCIL BUTTON HOLSTER BLD 10FT (ELECTRODE) ×3 IMPLANT
SLEEVE SCD COMPRESS KNEE MED (MISCELLANEOUS) ×2 IMPLANT
STRIP CLOSURE SKIN 1/2X4 (GAUZE/BANDAGES/DRESSINGS) IMPLANT
SUT ETHILON 3 0 PS 1 (SUTURE) ×2 IMPLANT
SUT ETHILON 4 0 PS 2 18 (SUTURE) IMPLANT
SUT MNCRL AB 4-0 PS2 18 (SUTURE) IMPLANT
SUT VICRYL 3-0 CR8 SH (SUTURE) ×1 IMPLANT
SUT VICRYL 4-0 PS2 18IN ABS (SUTURE) IMPLANT
SYR CONTROL 10ML LL (SYRINGE) ×3 IMPLANT
TAPE CLOTH SURG 4X10 WHT LF (GAUZE/BANDAGES/DRESSINGS) ×2 IMPLANT
TOWEL OR 17X24 6PK STRL BLUE (TOWEL DISPOSABLE) ×6 IMPLANT
TOWEL OR NON WOVEN STRL DISP B (DISPOSABLE) ×3 IMPLANT
TRAY DSU PREP LF (CUSTOM PROCEDURE TRAY) ×2 IMPLANT

## 2017-04-29 NOTE — Anesthesia Postprocedure Evaluation (Signed)
Anesthesia Post Note  Patient: SEBASTYN TIEDT  Procedure(s) Performed: Procedure(s) (LRB): 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 (N/A)     Patient location during evaluation: PACU Anesthesia Type: General Level of consciousness: awake and alert Pain management: pain level controlled Vital Signs Assessment: post-procedure vital signs reviewed and stable Respiratory status: spontaneous breathing, nonlabored ventilation, respiratory function stable and patient connected to nasal cannula oxygen Cardiovascular status: blood pressure returned to baseline and stable Postop Assessment: no signs of nausea or vomiting Anesthetic complications: no    Last Vitals:  Vitals:   04/29/17 1515 04/29/17 1530  BP: (!) 122/91 114/78  Pulse: 72 72  Resp: 10 14  Temp:  36.4 C  SpO2: 94% 98%    Last Pain:  Vitals:   04/29/17 1530  TempSrc:   PainSc: 0-No pain                 Shelton Silvas

## 2017-04-29 NOTE — Interval H&P Note (Signed)
History and Physical Interval Note:  04/29/2017 1:39 PM  Daniel Petersen  has presented today for surgery, with the diagnosis of soft tissue mass in natal cleft  The various methods of treatment have been discussed with the patient and family. After consideration of risks, benefits and other options for treatment, the patient has consented to    Procedure(s): 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 (N/A) as a surgical intervention .    The patient's history has been reviewed, patient examined, no change in status, stable for surgery.  I have reviewed the patient's chart and labs.  Questions were answered to the patient's satisfaction.    Velora Heckler, MD, [facility] Office: [phone]    [name] Judie Petit

## 2017-04-29 NOTE — Anesthesia Procedure Notes (Signed)
Procedure Name: Intubation Date/Time: 04/29/2017 1:55 PM Performed by: Lyndee Leo Pre-anesthesia Checklist: Patient identified, Emergency Drugs available, Suction available and Patient being monitored Patient Re-evaluated:Patient Re-evaluated prior to induction Oxygen Delivery Method: Circle system utilized Preoxygenation: Pre-oxygenation with 100% oxygen Induction Type: IV induction Ventilation: Mask ventilation without difficulty Laryngoscope Size: Mac and 3 Grade View: Grade II Tube type: Oral Tube size: 7.0 mm Number of attempts: 1 Airway Equipment and Method: Stylet and Oral airway Placement Confirmation: ETT inserted through vocal cords under direct vision,  positive ETCO2 and breath sounds checked- equal and bilateral Tube secured with: Tape Dental Injury: Teeth and Oropharynx as per pre-operative assessment

## 2017-04-29 NOTE — Brief Op Note (Signed)
04/29/2017  2:38 PM  PATIENT:  Daniel Petersen  56 y.o. male  PRE-OPERATIVE DIAGNOSIS:  soft tissue mass in natal cleft  POST-OPERATIVE DIAGNOSIS:  soft tissue mass in natal cleft  PROCEDURE:  Procedure(s): 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 (N/A)  SURGEON:  Surgeon(s) and Role:    * Darnell Level, MD - Primary  ANESTHESIA:   general  EBL:  Total I/O In: 1000 [I.V.:1000] Out: -   BLOOD ADMINISTERED:none  DRAINS: none   LOCAL MEDICATIONS USED:  MARCAINE     SPECIMEN:  Excision  DISPOSITION OF SPECIMEN:  PATHOLOGY  COUNTS:  YES  TOURNIQUET:  * No tourniquets in log *  DICTATION: .Other Dictation: Dictation Number [id number]  PLAN OF CARE: Discharge to home after PACU  PATIENT DISPOSITION:  PACU - hemodynamically stable.   Delay start of Pharmacological VTE agent (>24hrs) due to surgical blood loss or risk of bleeding: yes  Velora Heckler, MD, [facility] Office: [phone]

## 2017-04-29 NOTE — Discharge Instructions (Signed)

## 2017-04-29 NOTE — Transfer of Care (Signed)
Immediate Anesthesia Transfer of Care Note  Patient: Daniel Petersen  Procedure(s) Performed: Procedure(s): 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 (N/A)  Patient Location: PACU  Anesthesia Type:General  Level of Consciousness: awake, sedated and patient cooperative  Airway & Oxygen Therapy: Patient Spontanous Breathing and Patient connected to face mask oxygen  Post-op Assessment: Report given to RN and Post -op Vital signs reviewed and stable  Post vital signs: Reviewed and stable  Last Vitals:  Vitals:   04/29/17 1216  BP: 119/74  Pulse: 64  Resp: 17  Temp: 37.1 C  SpO2: 98%    Last Pain:  Vitals:   04/29/17 1216  TempSrc: Oral         Complications: No apparent anesthesia complications

## 2017-04-29 NOTE — Anesthesia Preprocedure Evaluation (Addendum)
Anesthesia Evaluation  Patient identified by MRN, date of birth, ID band Patient awake    Reviewed: Allergy & Precautions, NPO status , Patient's Chart, lab work & pertinent test results  Airway Mallampati: III  TM Distance: >3 FB Neck ROM: Full    Dental  (+) Teeth Intact, Dental Advisory Given   Pulmonary neg pulmonary ROS,    breath sounds clear to auscultation       Cardiovascular negative cardio ROS   Rhythm:Regular Rate:Normal     Neuro/Psych  Headaches, Anxiety    GI/Hepatic Neg liver ROS, GERD  Medicated,  Endo/Other  negative endocrine ROS  Renal/GU negative Renal ROS     Musculoskeletal negative musculoskeletal ROS (+)   Abdominal   Peds  Hematology negative hematology ROS (+)   Anesthesia Other Findings Day of surgery medications reviewed with the patient.  Reproductive/Obstetrics                            Anesthesia Physical Anesthesia Plan  ASA: II  Anesthesia Plan: General   Post-op Pain Management:    Induction: Intravenous  PONV Risk Score and Plan: 3 and Ondansetron, Dexamethasone, Midazolam and Propofol infusion  Airway Management Planned: Oral ETT  Additional Equipment:   Intra-op Plan:   Post-operative Plan: Extubation in OR  Informed Consent: I have reviewed the patients History and Physical, chart, labs and discussed the procedure including the risks, benefits and alternatives for the proposed anesthesia with the patient or authorized representative who has indicated his/her understanding and acceptance.   Dental advisory given  Plan Discussed with: CRNA  Anesthesia Plan Comments:         Anesthesia Quick Evaluation

## 2017-05-02 ENCOUNTER — Encounter (HOSPITAL_BASED_OUTPATIENT_CLINIC_OR_DEPARTMENT_OTHER): Payer: Self-pay | Admitting: Surgery

## 2017-05-02 NOTE — Op Note (Signed)
NAME:  Daniel, Petersen                    ACCOUNT NO.:  MEDICAL RECORD NO.:  [id number]  LOCATION:                                 FACILITY:  PHYSICIAN:  Velora Heckler, MD           DATE OF BIRTH:  DATE OF PROCEDURE:  04/29/2017                              OPERATIVE REPORT   PREOPERATIVE DIAGNOSIS:  Soft tissue mass, upper natal cleft.  POSTOPERATIVE DIAGNOSIS:  Soft tissue mass, upper natal cleft.  PROCEDURE:  Excision, soft tissue mass, upper natal cleft.  SURGEON:  Velora Heckler, MD  ANESTHESIA:  General with local.  ESTIMATED BLOOD LOSS:  Minimal.  PREPARATION:  Betadine.  COMPLICATIONS:  None.  INDICATIONS:  The patient is a 56 year old male with an enlarging soft tissue mass in the upper natal cleft.  He was evaluated by Dermatology and referred to General Surgery for excision for definitive diagnosis and management.  BODY OF REPORT:  Procedure was done in OR #3 at the [facility].  The patient was brought to the operating room and placed on a stretcher.  Following induction of general anesthesia, the patient was turned to a prone position on the operating room table.  The patient was then positioned and then prepped and draped in the usual aseptic fashion.  After ascertaining that an adequate level of anesthesia had been achieved, an elliptical incision was made at the site of the mass. Dissection was carried sharply into the subcutaneous tissues using a #15 blade and Metzenbaum scissors.  The entire mass was excised.  It measures 3 cm x 2 cm x 1.5 cm.  It appears to be a large sebaceous cyst. It was submitted to Pathology for review.  Hemostasis was obtained throughout the wound with electrocautery.  Local anesthetic was infiltrated around the wound and underneath the skin edges.  Wound was then closed with deeply spaced interrupted 3-0 nylon vertical mattress sutures.  Wound was washed and dried, and antibiotic ointment and a dry gauze dressing  were applied.  The patient was awakened from anesthesia and brought to the recovery room.  The patient tolerated the procedure well.   Velora Heckler, MD, [facility] Office: [phone]    TMG/MEDQ  D:  04/29/2017  T:  04/29/2017  Job:  [id number]

## 2017-05-03 DIAGNOSIS — N401 Enlarged prostate with lower urinary tract symptoms: Secondary | ICD-10-CM | POA: Diagnosis not present

## 2017-05-03 DIAGNOSIS — R310 Gross hematuria: Secondary | ICD-10-CM | POA: Diagnosis not present

## 2017-05-03 DIAGNOSIS — N281 Cyst of kidney, acquired: Secondary | ICD-10-CM | POA: Diagnosis not present

## 2017-05-03 DIAGNOSIS — R350 Frequency of micturition: Secondary | ICD-10-CM | POA: Diagnosis not present

## 2017-05-30 ENCOUNTER — Other Ambulatory Visit: Payer: Self-pay | Admitting: Internal Medicine

## 2017-07-10 ENCOUNTER — Other Ambulatory Visit: Payer: Self-pay | Admitting: Internal Medicine

## 2017-07-23 ENCOUNTER — Other Ambulatory Visit: Payer: Self-pay | Admitting: Internal Medicine

## 2017-08-12 ENCOUNTER — Other Ambulatory Visit: Payer: Self-pay | Admitting: Internal Medicine

## 2017-08-14 ENCOUNTER — Other Ambulatory Visit: Payer: Self-pay | Admitting: Internal Medicine

## 2018-02-14 ENCOUNTER — Ambulatory Visit: Payer: 59 | Admitting: Internal Medicine

## 2018-02-14 ENCOUNTER — Encounter: Payer: Self-pay | Admitting: Internal Medicine

## 2018-02-14 VITALS — BP 126/74 | HR 76 | Temp 97.4°F | Resp 16 | Ht 71.0 in | Wt 181.0 lb

## 2018-02-14 DIAGNOSIS — F419 Anxiety disorder, unspecified: Secondary | ICD-10-CM | POA: Diagnosis not present

## 2018-02-14 DIAGNOSIS — R112 Nausea with vomiting, unspecified: Secondary | ICD-10-CM | POA: Diagnosis not present

## 2018-02-14 DIAGNOSIS — N401 Enlarged prostate with lower urinary tract symptoms: Secondary | ICD-10-CM

## 2018-02-14 LAB — COMPREHENSIVE METABOLIC PANEL
ALK PHOS: 75 U/L (ref 39–117)
ALT: 16 U/L (ref 0–53)
AST: 15 U/L (ref 0–37)
Albumin: 4.1 g/dL (ref 3.5–5.2)
BUN: 15 mg/dL (ref 6–23)
CO2: 32 mEq/L (ref 19–32)
CREATININE: 0.97 mg/dL (ref 0.40–1.50)
Calcium: 9.8 mg/dL (ref 8.4–10.5)
Chloride: 105 mEq/L (ref 96–112)
GFR: 84.95 mL/min (ref >60.00)
Glucose, Bld: 97 mg/dL (ref 70–99)
Potassium: 4.6 mEq/L (ref 3.5–5.1)
SODIUM: 144 meq/L (ref 135–145)
TOTAL PROTEIN: 6.8 g/dL (ref 6.0–8.3)
Total Bilirubin: 0.8 mg/dL (ref 0.2–1.2)

## 2018-02-14 LAB — CBC WITH DIFFERENTIAL/PLATELET
BASOS PCT: 0.9 % (ref 0.0–3.0)
Basophils Absolute: 0.1 10*3/uL (ref 0.0–0.1)
EOS ABS: 0.1 10*3/uL (ref 0.0–0.7)
Eosinophils Relative: 1.1 % (ref 0.0–5.0)
HCT: 42.8 % (ref 39.0–52.0)
HEMOGLOBIN: 14.8 g/dL (ref 13.0–17.0)
Lymphocytes Relative: 28.2 % (ref 12.0–46.0)
Lymphs Abs: 2 10*3/uL (ref 0.7–4.0)
MCHC: 34.7 g/dL (ref 30.0–36.0)
MCV: 90.8 fl (ref 78.0–100.0)
MONO ABS: 0.6 10*3/uL (ref 0.1–1.0)
Monocytes Relative: 7.6 % (ref 3.0–12.0)
NEUTROS PCT: 62.2 % (ref 43.0–77.0)
Neutro Abs: 4.5 10*3/uL (ref 1.4–7.7)
Platelets: 307 10*3/uL (ref 150.0–400.0)
RBC: 4.71 Mil/uL (ref 4.22–5.81)
RDW: 13.3 % (ref 11.5–15.5)
WBC: 7.2 10*3/uL (ref 4.0–10.5)

## 2018-02-14 MED ORDER — OMEPRAZOLE 40 MG PO CPDR: 40.0000 mg | DELAYED_RELEASE_CAPSULE | Freq: Every day | ORAL | 1 refills | Status: DC

## 2018-02-14 MED ORDER — SERTRALINE HCL 100 MG PO TABS: 100.0000 mg | ORAL_TABLET | Freq: Every day | ORAL | 1 refills | Status: DC

## 2018-02-14 NOTE — Progress Notes (Signed)
Subjective:    Patient ID: Daniel Petersen, male    DOB: Jun 06, 1961, 57 y.o.   MRN: [id number]  DOS:  02/14/2018 Type of visit - description : Acute visit Interval history: Yesterday , after he ate supper he had 2 or 3 episodes of vomiting along with nausea.  No hematemesis. After that he become  very tired and weak and he remains somewhat fatigued. He wonders if it is related to working outside.  He is a Curator, works many hours outdoors, he will only have fluids at the beginning and at the end of his shift. Had similar episodes (nausea and fatigue) 2 weeks ago.  Has not been seen in a while so we discussed other issues. Anxiety depression, ran out of sertraline several months ago but think it was helping.  Refill?Marland Kitchen LUTS: Last note available from urology reviewed.   Review of Systems Denies fever chills.  No abdominal pain or diarrhea Bowel movements are normal with no dark stools or bloody stools. He has a history of migraines, no recent problems, not taking any NSAIDs recently. No dysuria, gross hematuria or headaches. Occasionally feels dizzy, on and off, not daily or severe symptoms Does have a skin lesion in the back, few weeks ago his daughter draining some pus.  Past Medical History:  Diagnosis Date  . Anxiety   . BPH (benign prostatic hyperplasia)   . Enlarged prostate on rectal examination 2017  . GERD (gastroesophageal reflux disease)   . Migraine    CURRENTLY ENROLLED IN [facility]  . Snoring    s/p 3 sleep study: all neg , last 2011  . Stuttering   . Urinary urgency 2017    Past Surgical History:  Procedure Laterality Date  . MASS EXCISION N/A 04/29/2017   Procedure: 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;  Surgeon: Darnell Level, MD;  Location: [facility];  Service: General;  Laterality: N/A;  . NASAL SEPTUM SURGERY  2010    Social History   Socioeconomic History  . Marital status: Married    Spouse name: [name]  . Number of  children: 2  . Years of education: Associate  . Highest education level: Not on file  Occupational History  . Occupation: not working at present  Social Needs  . Financial resource strain: Not on file  . Food insecurity:    Worry: Not on file    Inability: Not on file  . Transportation needs:    Medical: Not on file    Non-medical: Not on file  Tobacco Use  . Smoking status: Never Smoker  . Smokeless tobacco: Never Used  Substance and Sexual Activity  . Alcohol use: No    Alcohol/week: 0.0 oz  . Drug use: No  . Sexual activity: Yes    Partners: Female  Lifestyle  . Physical activity:    Days per week: Not on file    Minutes per session: Not on file  . Stress: Not on file  Relationships  . Social connections:    Talks on phone: Not on file    Gets together: Not on file    Attends religious service: Not on file    Active member of club or organization: Not on file    Attends meetings of clubs or organizations: Not on file    Relationship status: Not on file  . Intimate partner violence:    Fear of current or ex partner: Not on file    Emotionally abused:  Not on file    Physically abused: Not on file    Forced sexual activity: Not on file  Other Topics Concern  . Not on file  Social History Narrative   Lives with wife who is in a wheelchair, has to drive her ; she still works     2 adult children      Allergies as of 02/14/2018   No Known Allergies     Medication List        Accurate as of 02/14/18 11:59 PM. Always use your most recent med list.          CAMBIA 50 MG Pack Generic drug:  Diclofenac Potassium Take 1 packet by mouth as needed.   omeprazole 40 MG capsule Commonly known as:  PRILOSEC Take 1 capsule (40 mg total) by mouth daily.   sertraline 100 MG tablet Commonly known as:  ZOLOFT Take 1 tablet (100 mg total) by mouth daily.   tamsulosin 0.4 MG Caps capsule Commonly known as:  FLOMAX Take 0.4 mg by mouth daily.   zolmitriptan 5 MG  tablet Commonly known as:  ZOMIG Take 5 mg by mouth as needed.          Objective:   Physical Exam  Skin:      BP 126/74 (BP Location: Left Arm, Patient Position: Sitting, Cuff Size: Small)   Pulse 76   Temp (!) 97.4 F (36.3 C) (Oral)   Resp 16   Ht 5\' 11"  (1.803 m)   Wt 181 lb (82.1 kg)   SpO2 96%   BMI 25.24 kg/m  General:   Well developed, well nourished . NAD.  HEENT:  Normocephalic . Face symmetric, atraumatic.  Tongue is a slightly dry. Lungs:  CTA B Normal respiratory effort, no intercostal retractions, no accessory muscle use. Heart: RRR,  no murmur.  no pretibial edema bilaterally  Abdomen:  Not distended, soft, non-tender. No rebound or rigidity.   Neurologic:  alert & oriented X3.  Speech normal, gait appropriate for age and unassisted Psych--  Cognition and judgment appear intact.  Cooperative with normal attention span and concentration.  Behavior appropriate. No anxious or depressed appearing.     Assessment & Plan:   Assessment > Anxiety GERD Migraines, f/u neuro stuttering  Snoring --sleep study 3, all  Negative,  last 2011 BPH: LUTS sx after cscope 05-2015 (06-2015: nl DRE, UA, UDX neg, PSA wnl; flomax intolerant); urology eval 2017, had a CT-- "massive gland" Back pain-- seen elsewhere 2016 , had a MRI  PLAN: Nausea, vomiting: Had 2 episodes of nausea, fatigue, vomiting yesterday and 2 weeks ago.  Occasionally feels dizzy.  I wonder if related to dehydration, he works outside and does not take a lot of fluids. Recommend good hydration, check a CMP and CBC otherwise observation. Anxiety: Reports a lot of stress at home, ran out of sertraline 150 mg several months ago, would like to restart.  Plan: Sertraline 50 mg daily, then  100 mg.  Reassess on RTC. Cyst, back: Recommend to check the area from time to time, if the opening is not closing he may need to see dermatology.  Marland Kitchen. BPH: Last from urology 04-2017, they are contemplating TURP versus  a prostatectomy F/u June as schedule

## 2018-02-14 NOTE — Progress Notes (Signed)
Pre visit review using our clinic review tool, if applicable. No additional management support is needed unless otherwise documented below in the visit note. 

## 2018-02-14 NOTE — Patient Instructions (Signed)
GO TO THE LAB : Get the blood work    Keep your appointment for June  When working outside: Keep a container with cold water and drink plenty of fluid throughout your shift  If you feel dizzy or weak when you are outside, seek shelter.  Sertraline 100 mg: Take half tablet daily x10 days, then 1 tablet daily

## 2018-02-15 ENCOUNTER — Encounter: Payer: Self-pay | Admitting: Internal Medicine

## 2018-02-15 NOTE — Assessment & Plan Note (Signed)
Nausea, vomiting: Had 2 episodes of nausea, fatigue, vomiting yesterday and 2 weeks ago.  Occasionally feels dizzy.  I wonder if related to dehydration, he works outside and does not take a lot of fluids. Recommend good hydration, check a CMP and CBC otherwise observation. Anxiety: Reports a lot of stress at home, ran out of sertraline 150 mg several months ago, would like to restart.  Plan: Sertraline 50 mg daily, then  100 mg.  Reassess on RTC. Cyst, back: Recommend to check the area from time to time, if the opening is not closing he may need to see dermatology.  Marland Kitchen. BPH: Last from urology 04-2017, they are contemplating TURP versus a prostatectomy F/u June as schedule

## 2018-02-24 ENCOUNTER — Ambulatory Visit (INDEPENDENT_AMBULATORY_CARE_PROVIDER_SITE_OTHER): Payer: 59 | Admitting: Internal Medicine

## 2018-02-24 ENCOUNTER — Encounter: Payer: Self-pay | Admitting: Internal Medicine

## 2018-02-24 VITALS — BP 122/78 | HR 71 | Temp 98.4°F | Resp 14 | Ht 71.0 in | Wt 180.0 lb

## 2018-02-24 DIAGNOSIS — Z23 Encounter for immunization: Secondary | ICD-10-CM

## 2018-02-24 DIAGNOSIS — Z0000 Encounter for general adult medical examination without abnormal findings: Secondary | ICD-10-CM | POA: Diagnosis not present

## 2018-02-24 MED ORDER — OMEPRAZOLE 40 MG PO CPDR: 40.0000 mg | DELAYED_RELEASE_CAPSULE | Freq: Every day | ORAL | 3 refills | Status: DC

## 2018-02-24 MED ORDER — SERTRALINE HCL 100 MG PO TABS: 100.0000 mg | ORAL_TABLET | Freq: Every day | ORAL | 3 refills | Status: DC

## 2018-02-24 NOTE — Assessment & Plan Note (Signed)
Nausea, vomiting: See last OV, labs normal, symptoms resolved, no further intervention needed at this point. Anxiety: Controlled late on sertraline 100 mg, refill sent. GERD: Refill PPIs, symptoms control as far as long as he takes medicines Migraines: [facility] enrolled in a  Research study BPH: Last visit with urology [phone]. RTC 1 year, sooner if needed

## 2018-02-24 NOTE — Assessment & Plan Note (Addendum)
-  Td 02-2018  - CCS: Cscope 05-2015, wnl, 10 years --prostate ca screening: sees urology --Diet-exercise discussed --Labs (not fasting): Hep C, FLP, TSH --EKG today: NSR

## 2018-02-24 NOTE — Progress Notes (Signed)
Subjective:    Patient ID: Daniel Petersen, male    DOB: April 10, 1961, 57 y.o.   MRN: [id number]  DOS:  02/24/2018 Type of visit - description : cpx Interval history:  No major concerns today.   Review of Systems See last visit, had problems with nausea, labs okay, symptoms completely gone.  Other than above, a 14 point review of systems is negative     Past Medical History:  Diagnosis Date  . Anxiety   . BPH (benign prostatic hyperplasia)   . Enlarged prostate on rectal examination 2017  . GERD (gastroesophageal reflux disease)   . Migraine    CURRENTLY [facility]LL[facility] IN [facility]  . Snoring    s/p 3 sleep study: all neg , last 2011  . Stuttering   . Urinary urgency 2017    Past Surgical History:  Procedure Laterality Date  . 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 NASAL SEPTUM SURGERY  2010    Social History   Socioeconomic History  . Marital status: Married    Spouse name: [name]  . Number of children: 2  . Years of education: Associate  . Highest education level: Not on file  Occupational History  . Occupation: works , Materials engineer  . Financial resource strain: Not on file  . Food insecurity:    Worry: Not on file    Inability: Not on file  . Transportation needs:    Medical: Not on file    Non-medical: Not on file  Tobacco Use  . Smoking status: Never Smoker  . Smokeless tobacco: Never Used  Substance and Sexual Activity  . Alcohol use: No    Alcohol/week: 0.0 oz  . Drug use: No  . Sexual activity: Yes    Partners: Female  Lifestyle  . Physical activity:    Days per week: Not on file    Minutes per session: Not on file  . Stress: Not on file  Relationships  . Social connections:    Talks on phone: Not on file    Gets together: Not on file    Attends religious service: Not on  file    Active member of club or organization: Not on file    Attends meetings of clubs or organizations: Not on file    Relationship status: Not on file  . Intimate partner violence:    Fear of current or ex partner: Not on file    Emotionally abused: Not on file    Physically abused: Not on file    Forced sexual activity: Not on file  Other Topics Concern  . Not on file  Social History Narrative   Lives with wife who is in a wheelchair most of the time, has to drive her; she still works    2 adult children     Family History  Problem Relation Age of Onset  . Stroke Mother 33          . Dementia Mother   . Diabetes Maternal Grandmother   . Colon cancer Neg Hx   . Prostate cancer Neg Hx   . CAD Neg Hx      Allergies as of 02/24/2018   No Known Allergies     Medication List        Accurate as of 02/24/18  4:53 PM.  Always use your most recent med list.          CAMBIA 50 MG Pack Generic drug:  Diclofenac Potassium Take 1 packet by mouth as needed.   omeprazole 40 MG capsule Commonly known as:  PRILOSEC Take 1 capsule (40 mg total) by mouth daily.   sertraline 100 MG tablet Commonly known as:  ZOLOFT Take 1 tablet (100 mg total) by mouth daily.   tamsulosin 0.4 MG Caps capsule Commonly known as:  FLOMAX Take 0.4 mg by mouth daily.   zolmitriptan 5 MG tablet Commonly known as:  ZOMIG Take 5 mg by mouth as needed.          Objective:   Physical Exam BP 122/78 (BP Location: Left Arm, Patient Position: Sitting, Cuff Size: Small)   Pulse 71   Temp 98.4 F (36.9 C) (Oral)   Resp 14   Ht 5\' 11"  (1.803 m)   Wt 180 lb (81.6 kg)   SpO2 99%   BMI 25.10 kg/m  General: Well developed, NAD, see BMI.  Neck: No  thyromegaly  HEENT:  Normocephalic . Face symmetric, atraumatic Lungs:  CTA B Normal respiratory effort, no intercostal retractions, no accessory muscle use. Heart: RRR,  no murmur.  No pretibial edema bilaterally  Abdomen:  Not distended,  soft, non-tender. No rebound or rigidity.   Skin: See last visit, had a skin opening on his back,  the area seems to be healing well Neurologic:  alert & oriented X3.  Speech normal, gait appropriate for age and unassisted Strength symmetric and appropriate for age.  Psych: Cognition and judgment appear intact.  Cooperative with normal attention span and concentration.  Behavior appropriate. No anxious or depressed appearing.     Assessment & Plan:  Assessment > Anxiety GERD Migraines, f/u neuro stuttering  Snoring --sleep study 3, all  Negative,  last 2011 BPH: LUTS sx after cscope 05-2015 (06-2015: nl DRE, UA, UDX neg, PSA wnl; flomax intolerant); urology eval 2017, had a CT-- "massive gland" Back pain-- seen elsewhere 2016 , had a MRI  PLAN: Nausea, vomiting: See last OV, labs normal, symptoms resolved, no further intervention needed at this point. Anxiety: Controlled late on sertraline 100 mg, refill sent. GERD: Refill PPIs, symptoms control as far as long as he takes medicines Migraines: [facility] enrolled in a  Research study BPH: Last visit with urology [phone]. RTC 1 year, sooner if needed

## 2018-02-24 NOTE — Progress Notes (Signed)
Pre visit review using our clinic review tool, if applicable. No additional management support is needed unless otherwise documented below in the visit note. 

## 2018-02-24 NOTE — Patient Instructions (Signed)
GO TO THE LAB : Get the blood work     GO TO THE FRONT DESK Schedule your next appointment   for a physical exam in 1 year 

## 2018-02-25 LAB — HEPATITIS C ANTIBODY
HEP C AB: NONREACTIVE
SIGNAL TO CUT-OFF: 0.01 (ref <1.00)

## 2018-02-25 LAB — LIPID PANEL
CHOLESTEROL: 135 mg/dL (ref <200)
HDL: 52 mg/dL (ref >40)
LDL Cholesterol (Calc): 66 mg/dL (calc)
NON-HDL CHOLESTEROL (CALC): 83 mg/dL (ref <130)
TRIGLYCERIDES: 91 mg/dL (ref <150)
Total CHOL/HDL Ratio: 2.6 (calc) (ref <5.0)

## 2018-02-25 LAB — TSH: TSH: 1.17 mIU/L (ref 0.40–4.50)

## 2018-02-27 ENCOUNTER — Telehealth: Payer: Self-pay

## 2018-02-27 NOTE — Telephone Encounter (Signed)
Physical form completed and faxed to [facility] at [phone]. Form sent for scanning.  

## 2018-03-02 ENCOUNTER — Telehealth: Payer: Self-pay

## 2018-03-02 NOTE — Telephone Encounter (Signed)
Awaiting form to be scanned to chart.

## 2018-03-02 NOTE — Telephone Encounter (Signed)
Copied from CRM [id number]. Topic: Inquiry >> Mar 02, 2018 11:00 AM Yvonna Alanisobinson, Andra M wrote: Reason for CRM: [facility] called stating that [facility]. Patient states that the form will need to be redone and resent. Please call the patient at [phone].       Thank You!!!

## 2018-03-09 NOTE — Telephone Encounter (Signed)
Form in scanned section- reprinted- I had missed the "date test(s) performed area- this has been completed and refaxed to [facility]- [phone].

## 2018-03-17 DIAGNOSIS — G43019 Migraine without aura, intractable, without status migrainosus: Secondary | ICD-10-CM | POA: Diagnosis not present

## 2018-03-17 DIAGNOSIS — Z049 Encounter for examination and observation for unspecified reason: Secondary | ICD-10-CM | POA: Diagnosis not present

## 2018-06-27 DIAGNOSIS — R310 Gross hematuria: Secondary | ICD-10-CM | POA: Diagnosis not present

## 2018-06-27 DIAGNOSIS — Z125 Encounter for screening for malignant neoplasm of prostate: Secondary | ICD-10-CM | POA: Diagnosis not present

## 2018-06-27 DIAGNOSIS — R350 Frequency of micturition: Secondary | ICD-10-CM | POA: Diagnosis not present

## 2018-06-27 DIAGNOSIS — N401 Enlarged prostate with lower urinary tract symptoms: Secondary | ICD-10-CM | POA: Diagnosis not present

## 2018-07-27 DIAGNOSIS — L720 Epidermal cyst: Secondary | ICD-10-CM | POA: Diagnosis not present

## 2018-07-27 DIAGNOSIS — L738 Other specified follicular disorders: Secondary | ICD-10-CM | POA: Diagnosis not present

## 2018-10-03 DIAGNOSIS — M545 Low back pain: Secondary | ICD-10-CM | POA: Diagnosis not present

## 2018-10-03 DIAGNOSIS — M5136 Other intervertebral disc degeneration, lumbar region: Secondary | ICD-10-CM | POA: Diagnosis not present

## 2018-10-16 DIAGNOSIS — M545 Low back pain: Secondary | ICD-10-CM | POA: Diagnosis not present

## 2018-10-23 DIAGNOSIS — M545 Low back pain: Secondary | ICD-10-CM | POA: Diagnosis not present

## 2018-10-30 DIAGNOSIS — G43019 Migraine without aura, intractable, without status migrainosus: Secondary | ICD-10-CM | POA: Diagnosis not present

## 2018-10-31 DIAGNOSIS — M545 Low back pain: Secondary | ICD-10-CM | POA: Diagnosis not present

## 2018-12-14 DIAGNOSIS — M25562 Pain in left knee: Secondary | ICD-10-CM | POA: Diagnosis not present

## 2018-12-15 ENCOUNTER — Other Ambulatory Visit: Payer: Self-pay | Admitting: Internal Medicine

## 2018-12-15 ENCOUNTER — Encounter: Payer: Self-pay | Admitting: Internal Medicine

## 2018-12-15 MED ORDER — SERTRALINE HCL 100 MG PO TABS: 150.0000 mg | ORAL_TABLET | Freq: Every day | ORAL | 0 refills | Status: DC

## 2019-01-08 ENCOUNTER — Other Ambulatory Visit: Payer: Self-pay

## 2019-01-08 ENCOUNTER — Ambulatory Visit (INDEPENDENT_AMBULATORY_CARE_PROVIDER_SITE_OTHER): Payer: 59 | Admitting: Internal Medicine

## 2019-01-08 DIAGNOSIS — N401 Enlarged prostate with lower urinary tract symptoms: Secondary | ICD-10-CM | POA: Diagnosis not present

## 2019-01-08 DIAGNOSIS — G43809 Other migraine, not intractable, without status migrainosus: Secondary | ICD-10-CM

## 2019-01-08 DIAGNOSIS — F419 Anxiety disorder, unspecified: Secondary | ICD-10-CM | POA: Diagnosis not present

## 2019-01-08 NOTE — Progress Notes (Signed)
Subjective:    Patient ID: Daniel Petersen, male    DOB: 1961/07/01, 58 y.o.   MRN: [id number]  DOS:  01/08/2019 Type of visit - description: Virtual Visit via Video Note  I connected with@ on 01/09/19 at  2:00 PM EDT by a video enabled telemedicine application and verified that I am speaking with the correct person using two identifiers.   THIS ENCOUNTER IS A VIRTUAL VISIT DUE TO COVID-19 - PATIENT WAS NOT SEEN IN THE OFFICE. PATIENT HAS CONSENTED TO VIRTUAL VISIT / TELEMEDICINE VISIT   Location of patient: home  Location of provider: office  I discussed the limitations of evaluation and management by telemedicine and the availability of in person appointments. The patient expressed understanding and agreed to proceed.  History of Present Illness: Follow-up Recently, sertraline dose was increased.  Patient reports he was more stress lately, both work and family related. He is now feeling better. Symptoms have significantly improved. COVID-19: He is still working, his job provide him with good PPE. Has no other concerns    Review of Systems Denies fever chills No chest pain or cough No dysuria.  Gross hematuria.  Urinary flow is very good  Past Medical History:  Diagnosis Date  . Anxiety   . BPH (benign prostatic hyperplasia)   . Enlarged prostate on rectal examination 2017  . GERD (gastroesophageal reflux disease)   . Migraine    CURRENTLY [facility]LL[facility] IN [facility]  . Snoring    s/p 3 sleep study: all neg , last 2011  . Stuttering   . Urinary urgency 2017    Past Surgical History:  Procedure Laterality Date  . 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 NASAL SEPTUM SURGERY  2010    Social History   Socioeconomic History  . Marital status: Married    Spouse name: [name]  . Number of children: 2  . Years of  education: Associate  . Highest education level: Not on file  Occupational History  . Occupation: works , Materials engineer  . Financial resource strain: Not on file  . Food insecurity:    Worry: Not on file    Inability: Not on file  . Transportation needs:    Medical: Not on file    Non-medical: Not on file  Tobacco Use  . Smoking status: Never Smoker  . Smokeless tobacco: Never Used  Substance and Sexual Activity  . Alcohol use: No    Alcohol/week: 0.0 standard drinks  . Drug use: No  . Sexual activity: Yes    Partners: Female  Lifestyle  . Physical activity:    Days per week: Not on file    Minutes per session: Not on file  . Stress: Not on file  Relationships  . Social connections:    Talks on phone: Not on file    Gets together: Not on file    Attends religious service: Not on file    Active member of club or organization: Not on file    Attends meetings of clubs or organizations: Not on file    Relationship status: Not on file  . Intimate partner violence:    Fear of current or ex partner: Not on file    Emotionally abused: Not on file    Physically abused: Not on file    Forced  sexual activity: Not on file  Other Topics Concern  . Not on file  Social History Narrative   Lives with wife who is in a wheelchair most of the time, has to drive her; she still works    2 adult children      Allergies as of 01/08/2019   No Known Allergies     Medication List       Accurate as of January 08, 2019 11:59 PM. Always use your most recent med list.        Cambia 50 MG Pack Generic drug:  Diclofenac Potassium Take 1 packet by mouth as needed.   omeprazole 40 MG capsule Commonly known as:  PRILOSEC Take 1 capsule (40 mg total) by mouth daily.   sertraline 100 MG tablet Commonly known as:  ZOLOFT Take 1.5 tablets (150 mg total) by mouth daily.   tamsulosin 0.4 MG Caps capsule Commonly known as:  FLOMAX Take 0.4 mg by mouth daily.   zolmitriptan 5  MG tablet Commonly known as:  ZOMIG Take 5 mg by mouth as needed.           Objective:   Physical Exam There were no vitals taken for this visit. This is a video conference, patient is alert oriented x3, no apparent distress, stuttering noted.    Assessment     Assessment > Anxiety GERD Migraines, f/u neuro stuttering  Snoring --sleep study 3, all  Negative,  last 2011 BPH: LUTS sx after cscope 05-2015 (06-2015: nl DRE, UA, UDX neg, PSA wnl; flomax intolerant); urology eval 2017, had a CT-- "massive gland" Back pain-- seen elsewhere 2016 , had a MRI  PLAN: Anxiety: Symptoms recently increased, 3 weeks ago sertraline dose went up to 150 mg, good compliance, no apparent side effects, doing better.  No change, call for refill when needed Migraines: Currently controlled on medication as needed BPH: Last note from urologist 2017, patient reports that he has been seen after that.  Reports no symptoms. Plan: Follow-up 03/02/2019, for CPX as a schedule  I discussed the assessment and treatment plan with the patient. The patient was provided an opportunity to ask questions and all were answered. The patient agreed with the plan and demonstrated an understanding of the instructions.   The patient was advised to call back or seek an in-person evaluation if the symptoms worsen or if the condition fails to improve as anticipated.

## 2019-01-09 NOTE — Assessment & Plan Note (Signed)
Anxiety: Symptoms recently increased, 3 weeks ago sertraline dose went up to 150 mg, good compliance, no apparent side effects, doing better.  No change, call for refill when needed Migraines: Currently controlled on medication as needed BPH: Last note from urologist 2017, patient reports that he has been seen after that.  Reports no symptoms. Plan: Follow-up 03/02/2019, for CPX as a schedule

## 2019-02-21 ENCOUNTER — Other Ambulatory Visit: Payer: Self-pay | Admitting: Internal Medicine

## 2019-03-02 ENCOUNTER — Encounter: Payer: 59 | Admitting: Internal Medicine

## 2019-03-29 ENCOUNTER — Other Ambulatory Visit: Payer: Self-pay | Admitting: Internal Medicine

## 2019-04-21 ENCOUNTER — Other Ambulatory Visit: Payer: Self-pay | Admitting: Internal Medicine

## 2019-05-19 ENCOUNTER — Other Ambulatory Visit: Payer: Self-pay | Admitting: Internal Medicine

## 2019-06-01 ENCOUNTER — Encounter: Payer: Self-pay | Admitting: Internal Medicine

## 2019-06-01 ENCOUNTER — Other Ambulatory Visit: Payer: Self-pay

## 2019-06-01 ENCOUNTER — Ambulatory Visit: Payer: 59 | Admitting: Internal Medicine

## 2019-06-01 VITALS — BP 116/68 | HR 78 | Temp 97.3°F | Resp 16 | Ht 71.0 in | Wt 190.1 lb

## 2019-06-01 DIAGNOSIS — F419 Anxiety disorder, unspecified: Secondary | ICD-10-CM

## 2019-06-01 DIAGNOSIS — R6889 Other general symptoms and signs: Secondary | ICD-10-CM

## 2019-06-01 MED ORDER — SERTRALINE HCL 100 MG PO TABS: 150.0000 mg | ORAL_TABLET | Freq: Every day | ORAL | 1 refills | Status: DC

## 2019-06-01 NOTE — Progress Notes (Signed)
Pre visit review using our clinic review tool, if applicable. No additional management support is needed unless otherwise documented below in the visit note. 

## 2019-06-01 NOTE — Progress Notes (Signed)
Subjective:    Patient ID: Daniel Petersen, male    DOB: Sep 24, 1960, 58 y.o.   MRN: [id number]  DOS:  06/01/2019 Type of visit - description: Follow-up  Anxiety, well controlled.  No apparent side effects.  He is concerned about his memory, he has noting himself to be slightly forgetful lately.  Also, would like to build his muscle mass.  Denies any other problems    Review of Systems Migraines are well controlled, LUTS controlled with current meds  Past Medical History:  Diagnosis Date  . Anxiety   . BPH (benign prostatic hyperplasia)   . Enlarged prostate on rectal examination 2017  . GERD (gastroesophageal reflux disease)   . Migraine    CURRENTLY [facility]LL[facility] IN [facility]  . Snoring    s/p 3 sleep study: all neg , last 2011  . Stuttering   . Urinary urgency 2017    Past Surgical History:  Procedure Laterality Date  . 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 NASAL SEPTUM SURGERY  2010    Social History   Socioeconomic History  . Marital status: Married    Spouse name: Daniel Petersen  . Number of children: 2  . Years of education: Associate  . Highest education level: Not on file  Occupational History  . Occupation: works , Materials engineertechnician  Social Needs  . Financial resource strain: Not on file  . Food insecurity    Worry: Not on file    Inability: Not on file  . Transportation needs    Medical: Not on file    Non-medical: Not on file  Tobacco Use  . Smoking status: Never Smoker  . Smokeless tobacco: Never Used  Substance and Sexual Activity  . Alcohol use: No    Alcohol/week: 0.0 standard drinks  . Drug use: No  . Sexual activity: Yes    Partners: Female  Lifestyle  . Physical activity    Days per week: Not on file    Minutes per session: Not on file  . Stress: Not on file  Relationships  . Social  Musicianconnections    Talks on phone: Not on file    Gets together: Not on file    Attends religious service: Not on file    Active member of club or organization: Not on file    Attends meetings of clubs or organizations: Not on file    Relationship status: Not on file  . Intimate partner violence    Fear of current or ex partner: Not on file    Emotionally abused: Not on file    Physically abused: Not on file    Forced sexual activity: Not on file  Other Topics Concern  . Not on file  Social History Narrative   Lives with wife who is in a wheelchair most of the time, has to drive her; she still works    2 adult children      Allergies as of 06/01/2019   No Known Allergies     Medication List       Accurate as of June 01, 2019  2:03 PM. If you have any questions, ask your nurse or doctor.        AIMOVIG (140 MG DOSE) [place] Inject into the skin.   Cambia 50 MG Pack Generic drug: Diclofenac Potassium Take 1 packet  by mouth as needed.   finasteride 5 MG tablet Commonly known as: PROSCAR Take 5 mg by mouth daily.   omeprazole 40 MG capsule Commonly known as: PRILOSEC Take 1 capsule (40 mg total) by mouth daily.   sertraline 100 MG tablet Commonly known as: ZOLOFT Take 1.5 tablets (150 mg total) by mouth daily.   tamsulosin 0.4 MG Caps capsule Commonly known as: FLOMAX Take 0.4 mg by mouth daily.   zolmitriptan 5 MG tablet Commonly known as: ZOMIG Take 5 mg by mouth as needed.           Objective:   Physical Exam BP 116/68 (BP Location: Left Arm, Patient Position: Sitting, Cuff Size: Normal)   Pulse 78   Temp (!) 97.3 F (36.3 C) (Temporal)   Resp 16   Ht 5\' 11"  (1.803 m)   Wt 190 lb 2 oz (86.2 kg)   SpO2 100%   BMI 26.52 kg/m  General:   Well developed, NAD, BMI noted. HEENT:  Normocephalic . Face symmetric, atraumatic Lungs:  CTA B Normal respiratory effort, no intercostal retractions, no accessory muscle use. Heart: RRR,  no murmur.  No  pretibial edema bilaterally  Skin: Not pale. Not jaundice Neurologic:  alert & oriented X3.  Speech normal, gait appropriate for age and unassisted Psych--  Cognition and judgment appear intact.  Cooperative with normal attention span and concentration.  Behavior appropriate. No anxious or depressed appearing.      Assessment    Assessment  Anxiety GERD Migraines, f/u neuro stuttering  Snoring --sleep study 3, all  Negative,  last 2011 BPH: LUTS sx after cscope 05-2015 (06-2015: nl DRE, UA, UDX neg, PSA wnl; flomax intolerant); urology eval 2017, had a CT-- "massive gland" Back pain-- seen elsewhere 2016 , had a MRI  PLAN: Anxiety: Well-controlled currently with sertraline, RF sent Migraines: Controlled with current regimen. C/o Forgetfulness: At some point may need a formal mental exam but today ask about dementia prevention>>> healthy diet (Mediterranean diet) , stay active physically and mentally. Muscle weakness: Reports that that has been the case all his life and would like to improve.  Recommend possibly going to the gym to get larger muscle mass.  Will need to eat enough proteins to accomplish that. Preventive care: Declined a flu shot, explained the benefits RTC 6 months CPX

## 2019-06-01 NOTE — Patient Instructions (Addendum)
GO TO THE FRONT DESK Schedule your next appointment for a physical in 6 months         Why follow it? Research shows. . Those who follow the Mediterranean diet have a reduced risk of heart disease  . The diet is associated with a reduced incidence of Parkinson's and Alzheimer's diseases . People following the diet may have longer life expectancies and lower rates of chronic diseases  . The Dietary Guidelines for Americans recommends the Mediterranean diet as an eating plan to promote health and prevent disease  What Is the Mediterranean Diet?  . Healthy eating plan based on typical foods and recipes of Mediterranean-style cooking . The diet is primarily a plant based diet; these foods should make up a majority of meals   Starches - Plant based foods should make up a majority of meals - They are an important sources of vitamins, minerals, energy, antioxidants, and fiber - Choose whole grains, foods high in fiber and minimally processed items  - Typical grain sources include wheat, oats, barley, corn, brown rice, bulgar, farro, millet, polenta, couscous  - Various types of beans include chickpeas, lentils, fava beans, black beans, white beans   Fruits  Veggies - Large quantities of antioxidant rich fruits & veggies; 6 or more servings  - Vegetables can be eaten raw or lightly drizzled with oil and cooked  - Vegetables common to the traditional Mediterranean Diet include: artichokes, arugula, beets, broccoli, brussel sprouts, cabbage, carrots, celery, collard greens, cucumbers, eggplant, kale, leeks, lemons, lettuce, mushrooms, okra, onions, peas, peppers, potatoes, pumpkin, radishes, rutabaga, shallots, spinach, sweet potatoes, turnips, zucchini - Fruits common to the Mediterranean Diet include: apples, apricots, avocados, cherries, clementines, dates, figs, grapefruits, grapes, melons, nectarines, oranges, peaches, pears, pomegranates, strawberries, tangerines  Fats - Replace butter and  margarine with healthy oils, such as olive oil, canola oil, and tahini  - Limit nuts to no more than a handful a day  - Nuts include walnuts, almonds, pecans, pistachios, pine nuts  - Limit or avoid candied, honey roasted or heavily salted nuts - Olives are central to the [employer] - can be eaten whole or used in a variety of dishes   Meats Protein - Limiting red meat: no more than a few times a month - When eating red meat: choose lean cuts and keep the portion to the size of deck of cards - Eggs: approx. 0 to 4 times a week  - Fish and lean poultry: at least 2 a week  - Healthy protein sources include, chicken, [place], lean beef, lamb - Increase intake of seafood such as tuna, salmon, trout, mackerel, shrimp, scallops - Avoid or limit high fat processed meats such as sausage and bacon  Dairy - Include moderate amounts of low fat dairy products  - Focus on healthy dairy such as fat free yogurt, skim milk, low or reduced fat cheese - Limit dairy products higher in fat such as whole or 2% milk, cheese, ice cream  Alcohol - Moderate amounts of red wine is ok  - No more than 5 oz daily for women (all ages) and men older than age 101  - No more than 10 oz of wine daily for men younger than 51  Other - Limit sweets and other desserts  - Use herbs and spices instead of salt to flavor foods  - Herbs and spices common to the traditional Mediterranean Diet include: basil, bay leaves, chives, cloves, cumin, fennel, garlic, lavender, marjoram, mint, oregano, parsley,  pepper, rosemary, sage, savory, sumac, tarragon, thyme   It's not just a diet, it's a lifestyle:  . The Mediterranean diet includes lifestyle factors typical of those in the region  . Foods, drinks and meals are best eaten with others and savored . Daily physical activity is important for overall good health . This could be strenuous exercise like running and aerobics . This could also be more leisurely activities such as  walking, housework, yard-work, or taking the stairs . Moderation is the key; a balanced and healthy diet accommodates most foods and drinks . Consider portion sizes and frequency of consumption of certain foods   Meal Ideas & Options:  . Breakfast:  o Whole wheat toast or whole wheat English muffins with peanut butter & hard boiled egg o Steel cut oats topped with apples & cinnamon and skim milk  o Fresh fruit: banana, strawberries, melon, berries, peaches  o Smoothies: strawberries, bananas, greek yogurt, peanut butter o Low fat greek yogurt with blueberries and granola  o Egg white omelet with spinach and mushrooms o Breakfast couscous: whole wheat couscous, apricots, skim milk, cranberries  . Sandwiches:  o Hummus and grilled vegetables (peppers, zucchini, squash) on whole wheat bread   o Grilled chicken on whole wheat pita with lettuce, tomatoes, cucumbers or tzatziki  o Tuna salad on whole wheat bread: tuna salad made with greek yogurt, olives, red peppers, capers, green onions o Garlic rosemary lamb pita: lamb sauted with garlic, rosemary, salt & pepper; add lettuce, cucumber, greek yogurt to pita - flavor with lemon juice and black pepper  . Seafood:  o Mediterranean grilled salmon, seasoned with garlic, basil, parsley, lemon juice and black pepper o Shrimp, lemon, and spinach whole-grain pasta salad made with low fat greek yogurt  o Seared scallops with lemon orzo  o Seared tuna steaks seasoned salt, pepper, coriander topped with tomato mixture of olives, tomatoes, olive oil, minced garlic, parsley, green onions and cappers  . Meats:  o Herbed greek chicken salad with kalamata olives, cucumber, feta  o Red bell peppers stuffed with spinach, bulgur, lean ground beef (or lentils) & topped with feta   o Kebabs: skewers of chicken, tomatoes, onions, zucchini, squash  o [place]urkey burgers: made with red onions, mint, dill, lemon juice, feta cheese topped with roasted red  peppers . Vegetarian o Cucumber salad: cucumbers, artichoke hearts, celery, red onion, feta cheese, tossed in olive oil & lemon juice  o Hummus and whole grain pita points with a greek salad (lettuce, tomato, feta, olives, cucumbers, red onion) o Lentil soup with celery, carrots made with vegetable broth, garlic, salt and pepper  o Tabouli salad: parsley, bulgur, mint, scallions, cucumbers, tomato, radishes, lemon juice, olive oil, salt and pepper.

## 2019-06-02 NOTE — Assessment & Plan Note (Signed)
Anxiety: Well-controlled currently with sertraline, RF sent Migraines: Controlled with current regimen. C/o Forgetfulness: At some point may need a formal mental exam but today ask about dementia prevention>>> healthy diet (Mediterranean diet) , stay active physically and mentally. Muscle weakness: Reports that that has been the case all his life and would like to improve.  Recommend possibly going to the gym to get larger muscle mass.  Will need to eat enough proteins to accomplish that. Preventive care: Declined a flu shot, explained the benefits RTC 6 months CPX

## 2019-11-27 ENCOUNTER — Other Ambulatory Visit: Payer: Self-pay

## 2019-11-28 ENCOUNTER — Other Ambulatory Visit: Payer: Self-pay

## 2019-11-28 ENCOUNTER — Ambulatory Visit: Payer: 59 | Admitting: Internal Medicine

## 2019-11-28 ENCOUNTER — Encounter: Payer: Self-pay | Admitting: Internal Medicine

## 2019-11-28 VITALS — BP 116/73 | HR 79 | Temp 96.2°F | Resp 16 | Ht 71.0 in | Wt 189.5 lb

## 2019-11-28 DIAGNOSIS — Z0000 Encounter for general adult medical examination without abnormal findings: Secondary | ICD-10-CM | POA: Diagnosis not present

## 2019-11-28 LAB — LIPID PANEL
Cholesterol: 146 mg/dL (ref 0–200)
HDL: 50.6 mg/dL (ref >39.00)
LDL Cholesterol: 78 mg/dL (ref 0–99)
NonHDL: 95.58
Total CHOL/HDL Ratio: 3
Triglycerides: 89 mg/dL (ref 0.0–149.0)
VLDL: 17.8 mg/dL (ref 0.0–40.0)

## 2019-11-28 LAB — CBC WITH DIFFERENTIAL/PLATELET
Basophils Absolute: 0.1 10*3/uL (ref 0.0–0.1)
Basophils Relative: 0.9 % (ref 0.0–3.0)
Eosinophils Absolute: 0.2 10*3/uL (ref 0.0–0.7)
Eosinophils Relative: 2.4 % (ref 0.0–5.0)
HCT: 41.5 % (ref 39.0–52.0)
Hemoglobin: 14.1 g/dL (ref 13.0–17.0)
Lymphocytes Relative: 24.9 % (ref 12.0–46.0)
Lymphs Abs: 1.9 10*3/uL (ref 0.7–4.0)
MCHC: 34 g/dL (ref 30.0–36.0)
MCV: 90.6 fl (ref 78.0–100.0)
Monocytes Absolute: 0.5 10*3/uL (ref 0.1–1.0)
Monocytes Relative: 7.4 % (ref 3.0–12.0)
Neutro Abs: 4.8 10*3/uL (ref 1.4–7.7)
Neutrophils Relative %: 64.4 % (ref 43.0–77.0)
Platelets: 237 10*3/uL (ref 150.0–400.0)
RBC: 4.58 Mil/uL (ref 4.22–5.81)
RDW: 13.9 % (ref 11.5–15.5)
WBC: 7.4 10*3/uL (ref 4.0–10.5)

## 2019-11-28 LAB — COMPREHENSIVE METABOLIC PANEL
ALT: 18 U/L (ref 0–53)
AST: 17 U/L (ref 0–37)
Albumin: 3.7 g/dL (ref 3.5–5.2)
Alkaline Phosphatase: 64 U/L (ref 39–117)
BUN: 20 mg/dL (ref 6–23)
CO2: 28 mEq/L (ref 19–32)
Calcium: 8.8 mg/dL (ref 8.4–10.5)
Chloride: 105 mEq/L (ref 96–112)
Creatinine, Ser: 1.09 mg/dL (ref 0.40–1.50)
GFR: 69.42 mL/min (ref >60.00)
Glucose, Bld: 85 mg/dL (ref 70–99)
Potassium: 4.1 mEq/L (ref 3.5–5.1)
Sodium: 138 mEq/L (ref 135–145)
Total Bilirubin: 0.4 mg/dL (ref 0.2–1.2)
Total Protein: 6.4 g/dL (ref 6.0–8.3)

## 2019-11-28 NOTE — Progress Notes (Signed)
Subjective:    Patient ID: Daniel Petersen, male    DOB: [birth year], 59 y.o.   MRN: [id number]  DOS:  11/28/2019 Type of visit - description: CPX Chronic medical problems seem well controlled except back pain. Reports low back pain described as tingling whenever he bends over.  Symptoms are not noticeable when he is standing or walking. No bladder or bowel incontinence No paresthesias of the lower extremities   Review of Systems  Other than above, a 14 point review of systems is negative      Past Medical History:  Diagnosis Date  . Anxiety   . BPH (benign prostatic hyperplasia)   . Enlarged prostate on rectal examination 2017  . GERD (gastroesophageal reflux disease)   . Migraine    CURRENTLY ENROLLED IN A STUDY BY [facility]  . Snoring    s/p 3 sleep study: all neg , last 2011  . Stuttering   . Urinary urgency 2017    Past Surgical History:  Procedure Laterality Date  . 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 NASAL SEPTUM SURGERY  2010   Family History  Problem Relation Age of Onset  . Stroke Mother 77          . Dementia Mother   . Diabetes Maternal Grandmother   . Colon cancer Neg Hx   . Prostate cancer Neg Hx   . CAD Neg Hx     Allergies as of 11/28/2019   No Known Allergies     Medication List       Accurate as of November 28, 2019 11:59 PM. If you have any questions, ask your nurse or doctor.        AIMOVIG (140 MG DOSE) [place] Inject into the skin.   finasteride 5 MG tablet Commonly known as: PROSCAR Take 5 mg by mouth daily.   omeprazole 40 MG capsule Commonly known as: PRILOSEC Take 1 capsule (40 mg total) by mouth daily.   sertraline 100 MG tablet Commonly known as: ZOLOFT Take 1.5 tablets (150 mg total) by mouth daily.   tamsulosin 0.4 MG Caps capsule Commonly known as: FLOMAX Take 0.4 mg  by mouth daily.   zolmitriptan 5 MG tablet Commonly known as: ZOMIG Take 5 mg by mouth as needed.          Objective:   Physical Exam BP 116/73 (BP Location: Left Arm, Patient Position: Sitting, Cuff Size: Normal)   Pulse 79   Temp (!) 96.2 F (35.7 C) (Temporal)   Resp 16   Ht 5\' 11"  (1.803 m)   Wt 189 lb 8 oz (86 kg)   SpO2 97%   BMI 26.43 kg/m  General: Well developed, NAD, BMI noted Neck: No  thyromegaly  HEENT:  Normocephalic . Face symmetric, atraumatic Lungs:  CTA B Normal respiratory effort, no intercostal retractions, no accessory muscle use. Heart: RRR,  no murmur.  Abdomen:  Not distended, soft, non-tender. No rebound or rigidity.   Lower extremities: no pretibial edema bilaterally MSK: No TTP at the lumbosacral spine or SI's Skin: Exposed areas without rash. Not pale. Not jaundice Neurologic:  alert & oriented X3.  Speech normal, gait appropriate for age and unassisted Strength symmetric and appropriate for age. DTRs symmetric Psych: Cognition and judgment appear intact.  Cooperative with normal attention span and concentration.  Behavior appropriate. No  anxious or depressed appearing.     Assessment     Assessment  Anxiety GERD Migraines, f/u neuro stuttering  Snoring --sleep study 3, all  Negative,  last 2011 BPH: LUTS sx after cscope 05-2015 (06-2015: nl DRE, UA, UDX neg, PSA wnl; flomax intolerant); urology eval 2017, had a CT-- "massive gland" Back pain-- seen elsewhere 2016 , had a MRI  PLAN: Here for CPX Anxiety: Well-controlled, RF SSRIs as needed Migraines: Well-controlled with current meds BPH: Sees urology Back pain: A chronic issue, no red flag symptoms, last visit with Dr. Nelva Bush 10-2018, x-rays were done, did physical therapy briefly but did not pursue daily stretching.  We talk about options and he elected to visit Dr. Nelva Bush again.  I did recommend daily stretching. RTC 1 year   This visit occurred during the SARS-CoV-2  public health emergency.  Safety protocols were in place, including screening questions prior to the visit, additional usage of staff PPE, and extensive cleaning of exam room while observing appropriate contact time as indicated for disinfecting solutions.

## 2019-11-28 NOTE — Patient Instructions (Signed)
GO TO THE LAB : Get the blood work     GO TO THE FRONT DESK Come back for a physical exam in 1 year, please make an appointment  

## 2019-11-28 NOTE — Progress Notes (Signed)
Pre visit review using our clinic review tool, if applicable. No additional management support is needed unless otherwise documented below in the visit note. 

## 2019-11-29 NOTE — Assessment & Plan Note (Signed)
-  Td 02-2018  - covid vaccine recommended  - CCS: Cscope 05-2015, wnl, 10 years --prostate ca screening: sees urology, last visit 07/12/2019 --Diet-exercise discussed --Labs: CMP, CBC, FLP

## 2019-11-29 NOTE — Assessment & Plan Note (Signed)
Here for CPX Anxiety: Well-controlled, RF SSRIs as needed Migraines: Well-controlled with current meds BPH: Sees urology Back pain: A chronic issue, no red flag symptoms, last visit with Dr. Ethelene Hal 10-2018, x-rays were done, did physical therapy briefly but did not pursue daily stretching.  We talk about options and he elected to visit Dr. Ethelene Hal again.  I did recommend daily stretching. RTC 1 year

## 2019-12-22 ENCOUNTER — Other Ambulatory Visit: Payer: Self-pay | Admitting: Internal Medicine

## 2020-02-26 ENCOUNTER — Other Ambulatory Visit: Payer: Self-pay | Admitting: Internal Medicine

## 2020-05-27 ENCOUNTER — Encounter: Payer: Self-pay | Admitting: Internal Medicine

## 2020-07-15 ENCOUNTER — Other Ambulatory Visit: Payer: Self-pay | Admitting: Internal Medicine

## 2020-11-12 ENCOUNTER — Other Ambulatory Visit: Payer: Self-pay

## 2020-11-12 ENCOUNTER — Encounter: Payer: Self-pay | Admitting: Internal Medicine

## 2020-11-12 ENCOUNTER — Ambulatory Visit (INDEPENDENT_AMBULATORY_CARE_PROVIDER_SITE_OTHER): Payer: 59 | Admitting: Internal Medicine

## 2020-11-12 ENCOUNTER — Encounter: Payer: Self-pay | Admitting: Psychology

## 2020-11-12 VITALS — BP 132/80 | HR 80 | Temp 97.6°F | Resp 16 | Ht 71.0 in | Wt 200.4 lb

## 2020-11-12 DIAGNOSIS — R41840 Attention and concentration deficit: Secondary | ICD-10-CM | POA: Diagnosis not present

## 2020-11-12 DIAGNOSIS — Z23 Encounter for immunization: Secondary | ICD-10-CM | POA: Diagnosis not present

## 2020-11-12 DIAGNOSIS — R413 Other amnesia: Secondary | ICD-10-CM

## 2020-11-12 DIAGNOSIS — F419 Anxiety disorder, unspecified: Secondary | ICD-10-CM | POA: Diagnosis not present

## 2020-11-12 NOTE — Progress Notes (Signed)
Pre visit review using our clinic review tool, if applicable. No additional management support is needed unless otherwise documented below in the visit note. 

## 2020-11-12 NOTE — Patient Instructions (Signed)
    GO TO THE FRONT DESK, PLEASE SCHEDULE YOUR APPOINTMENTS Come back for  your physical exam next month, fasting

## 2020-11-12 NOTE — Progress Notes (Signed)
Subjective:    Patient ID: Daniel Petersen, male    DOB: Nov 30, 1960, 60 y.o.   MRN: [id number]  DOS:  11/12/2020 Type of visit - description: Acute He has several concerns: In the last few months have noted decreasing memory, lack of concentration and poor energy.  Reports he has never gotten lost, does not misplace things, does not forget names but sometimes he forgets to do things his wife asks him to.  He also has been unemployed, not very active, has gained weight.  Denies depression or anxiety per se.   Wt Readings from Last 3 Encounters:  11/12/20 200 lb 6 oz (90.9 kg)  11/28/19 189 lb 8 oz (86 kg)  06/01/19 190 lb 2 oz (86.2 kg)     Review of Systems Denies fever chills.  No chest pain or difficulty breathing.  No lower extremity edema or palpitation. No unusual headache, dizziness or double vision  Past Medical History:  Diagnosis Date  . Anxiety   . BPH (benign prostatic hyperplasia)   . Enlarged prostate on rectal examination 2017  . GERD (gastroesophageal reflux disease)   . Migraine    CURRENTLY ENROLLED IN A STUDY BY [facility]  . Snoring    s/p 3 sleep study: all neg , last 2011  . Stuttering   . Urinary urgency 2017    Past Surgical History:  Procedure Laterality Date  . 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 NASAL SEPTUM SURGERY  2010    Allergies as of 11/12/2020   No Known Allergies     Medication List       Accurate as of November 12, 2020 11:59 PM. If you have any questions, ask your nurse or doctor.        AIMOVIG (140 MG DOSE) [place] Inject into the skin. What changed: Another medication with the same name was removed. Continue taking this medication, and follow the directions you see here. Changed by: Willow Ora, MD   finasteride 5 MG tablet Commonly known as: PROSCAR Take 5 mg by mouth daily.    omeprazole 40 MG capsule Commonly known as: PRILOSEC Take 1 capsule (40 mg total) by mouth daily.   sertraline 100 MG tablet Commonly known as: ZOLOFT TAKE 1.5 TABLETS BY MOUTH DAILY   tamsulosin 0.4 MG Caps capsule Commonly known as: FLOMAX Take 0.4 mg by mouth daily.   zolmitriptan 5 MG tablet Commonly known as: ZOMIG Take 5 mg by mouth as needed.          Objective:   Physical Exam BP 132/80 (BP Location: Left Arm, Patient Position: Sitting, Cuff Size: Normal)   Pulse 80   Temp 97.6 F (36.4 C) (Oral)   Resp 16   Ht 5\' 11"  (1.803 m)   Wt 200 lb 6 oz (90.9 kg)   SpO2 96%   BMI 27.95 kg/m  General:   Well developed, NAD, BMI noted. HEENT:  Normocephalic . Face symmetric, atraumatic Lungs:  CTA B Normal respiratory effort, no intercostal retractions, no accessory muscle use. Heart: RRR,  no murmur.  Lower extremities: no pretibial edema bilaterally  Skin: Not pale. Not jaundice Neurologic:  alert & oriented X3.  Speech: Stuttering seems worse done in previous visit. Gait appropriate for age and unassisted. EOMI, face and motor symmetric.  Significant stuttering not tested. Psych--  Cognition and  judgment appear intact.  Cooperative with normal attention span and concentration.  Behavior appropriate. Somewhat anxious but not depressed appearing.      Assessment      Assessment  Anxiety GERD Migraines, f/u neuro stuttering  Snoring --sleep study 3, all  Negative,  last 2011 BPH: LUTS sx after cscope 05-2015 (06-2015: nl DRE, UA, UDX neg, PSA wnl; flomax intolerant); urology eval 2017, had a CT-- "massive gland" Back pain-- seen elsewhere 2016 , had a MRI  PLAN: Anxiety, seems well controlled on sertraline. Decreased memory, poor concentration, lack of energy: He is perhaps somewhat forgetful, could not give me examples of poor concentration, he does have chronic stuttering and that seems to be slightly worse. In the past he was evaluated for sleep  apnea and reports 3 NEG  sleep studies. Recommend a formal neuropsychological evaluation to see if he has subtle memory loss or some other issues.  Referral sent. Will come back in 4 weeks for a CPX, blood work to be done. Preventive care: Flu shot today. RTC CPX next month     This visit occurred during the SARS-CoV-2 public health emergency.  Safety protocols were in place, including screening questions prior to the visit, additional usage of staff PPE, and extensive cleaning of exam room while observing appropriate contact time as indicated for disinfecting solutions.

## 2020-11-13 NOTE — Assessment & Plan Note (Signed)
Anxiety, seems well controlled on sertraline. Decreased memory, poor concentration, lack of energy: He is perhaps somewhat forgetful, could not give me examples of poor concentration, he does have chronic stuttering and that seems to be slightly worse. In the past he was evaluated for sleep apnea and reports 3 NEG  sleep studies. Recommend a formal neuropsychological evaluation to see if he has subtle memory loss or some other issues.  Referral sent. Will come back in 4 weeks for a CPX, blood work to be done. Preventive care: Flu shot today. RTC CPX next month

## 2020-12-08 LAB — PSA: PSA: 1.88

## 2020-12-09 ENCOUNTER — Encounter: Payer: Self-pay | Admitting: Internal Medicine

## 2020-12-24 ENCOUNTER — Encounter: Payer: 59 | Admitting: Internal Medicine

## 2021-01-01 ENCOUNTER — Encounter: Payer: 59 | Admitting: Psychology

## 2021-01-05 ENCOUNTER — Encounter: Payer: Self-pay | Admitting: Internal Medicine

## 2021-01-08 ENCOUNTER — Encounter: Payer: 59 | Admitting: Psychology

## 2021-02-18 ENCOUNTER — Other Ambulatory Visit: Payer: Self-pay | Admitting: Internal Medicine

## 2021-03-15 ENCOUNTER — Other Ambulatory Visit: Payer: Self-pay | Admitting: Internal Medicine

## 2021-04-13 ENCOUNTER — Other Ambulatory Visit: Payer: Self-pay | Admitting: Internal Medicine

## 2021-05-13 ENCOUNTER — Other Ambulatory Visit: Payer: Self-pay | Admitting: Internal Medicine

## 2021-06-12 ENCOUNTER — Observation Stay (HOSPITAL_COMMUNITY): Payer: BLUE CROSS/BLUE SHIELD | Admitting: Registered Nurse

## 2021-06-12 ENCOUNTER — Observation Stay (HOSPITAL_COMMUNITY): Payer: BLUE CROSS/BLUE SHIELD

## 2021-06-12 ENCOUNTER — Encounter (HOSPITAL_COMMUNITY): Payer: Self-pay

## 2021-06-12 ENCOUNTER — Encounter (HOSPITAL_COMMUNITY)
Admission: EM | Disposition: A | Discharge status: Home / Self Care | Payer: Self-pay | Source: Home / Self Care | Attending: Emergency Medicine

## 2021-06-12 ENCOUNTER — Observation Stay (HOSPITAL_COMMUNITY)
Admission: EM | Admit: 2021-06-12 | Discharge: 2021-06-13 | Disposition: A | Discharge status: Home / Self Care | Payer: BLUE CROSS/BLUE SHIELD | Attending: General Surgery | Admitting: General Surgery

## 2021-06-12 ENCOUNTER — Emergency Department (HOSPITAL_COMMUNITY): Payer: BLUE CROSS/BLUE SHIELD

## 2021-06-12 ENCOUNTER — Other Ambulatory Visit: Payer: Self-pay

## 2021-06-12 DIAGNOSIS — R109 Unspecified abdominal pain: Secondary | ICD-10-CM

## 2021-06-12 DIAGNOSIS — R1031 Right lower quadrant pain: Secondary | ICD-10-CM | POA: Diagnosis present

## 2021-06-12 DIAGNOSIS — K8020 Calculus of gallbladder without cholecystitis without obstruction: Secondary | ICD-10-CM

## 2021-06-12 DIAGNOSIS — K8012 Calculus of gallbladder with acute and chronic cholecystitis without obstruction: Secondary | ICD-10-CM | POA: Diagnosis not present

## 2021-06-12 DIAGNOSIS — Z419 Encounter for procedure for purposes other than remedying health state, unspecified: Secondary | ICD-10-CM

## 2021-06-12 DIAGNOSIS — K8050 Calculus of bile duct without cholangitis or cholecystitis without obstruction: Secondary | ICD-10-CM | POA: Diagnosis present

## 2021-06-12 DIAGNOSIS — Z20822 Contact with and (suspected) exposure to covid-19: Secondary | ICD-10-CM | POA: Diagnosis not present

## 2021-06-12 DIAGNOSIS — Z9049 Acquired absence of other specified parts of digestive tract: Secondary | ICD-10-CM

## 2021-06-12 DIAGNOSIS — K82A1 Gangrene of gallbladder in cholecystitis: Secondary | ICD-10-CM | POA: Diagnosis present

## 2021-06-12 HISTORY — PX: CHOLECYSTECTOMY: SHX55

## 2021-06-12 LAB — CBC WITH DIFFERENTIAL/PLATELET
Abs Immature Granulocytes: 0.07 10*3/uL (ref 0.00–0.07)
Basophils Absolute: 0.1 10*3/uL (ref 0.0–0.1)
Basophils Relative: 0 %
Eosinophils Absolute: 0 10*3/uL (ref 0.0–0.5)
Eosinophils Relative: 0 %
HCT: 42.1 % (ref 39.0–52.0)
Hemoglobin: 14.4 g/dL (ref 13.0–17.0)
Immature Granulocytes: 1 %
Lymphocytes Relative: 9 %
Lymphs Abs: 1.1 10*3/uL (ref 0.7–4.0)
MCH: 30.6 pg (ref 26.0–34.0)
MCHC: 34.2 g/dL (ref 30.0–36.0)
MCV: 89.4 fL (ref 80.0–100.0)
Monocytes Absolute: 0.4 10*3/uL (ref 0.1–1.0)
Monocytes Relative: 3 %
Neutro Abs: 10.8 10*3/uL — ABNORMAL HIGH (ref 1.7–7.7)
Neutrophils Relative %: 87 %
Platelets: 247 10*3/uL (ref 150–400)
RBC: 4.71 MIL/uL (ref 4.22–5.81)
RDW: 12.6 % (ref 11.5–15.5)
WBC: 12.5 10*3/uL — ABNORMAL HIGH (ref 4.0–10.5)
nRBC: 0 % (ref 0.0–0.2)

## 2021-06-12 LAB — URINALYSIS, ROUTINE W REFLEX MICROSCOPIC
Bilirubin Urine: NEGATIVE
Glucose, UA: NEGATIVE mg/dL
Hgb urine dipstick: NEGATIVE
Ketones, ur: NEGATIVE mg/dL
Leukocytes,Ua: NEGATIVE
Nitrite: NEGATIVE
Protein, ur: NEGATIVE mg/dL
Specific Gravity, Urine: 1.025 (ref 1.005–1.030)
pH: 5 (ref 5.0–8.0)

## 2021-06-12 LAB — COMPREHENSIVE METABOLIC PANEL
ALT: 15 U/L (ref 0–44)
AST: 17 U/L (ref 15–41)
Albumin: 3.9 g/dL (ref 3.5–5.0)
Alkaline Phosphatase: 72 U/L (ref 38–126)
Anion gap: 8 (ref 5–15)
BUN: 28 mg/dL — ABNORMAL HIGH (ref 6–20)
CO2: 25 mmol/L (ref 22–32)
Calcium: 9.3 mg/dL (ref 8.9–10.3)
Chloride: 108 mmol/L (ref 98–111)
Creatinine, Ser: 1.18 mg/dL (ref 0.61–1.24)
GFR, Estimated: >60 mL/min (ref >60)
Glucose, Bld: 135 mg/dL — ABNORMAL HIGH (ref 70–99)
Potassium: 4.1 mmol/L (ref 3.5–5.1)
Sodium: 141 mmol/L (ref 135–145)
Total Bilirubin: 0.4 mg/dL (ref 0.3–1.2)
Total Protein: 7.5 g/dL (ref 6.5–8.1)

## 2021-06-12 LAB — RESP PANEL BY RT-PCR (FLU A&B, COVID) ARPGX2
Influenza A by PCR: NEGATIVE
Influenza B by PCR: NEGATIVE
SARS Coronavirus 2 by RT PCR: NEGATIVE

## 2021-06-12 LAB — HIV ANTIBODY (ROUTINE TESTING W REFLEX): HIV Screen 4th Generation wRfx: NONREACTIVE

## 2021-06-12 LAB — SURGICAL PCR SCREEN
MRSA, PCR: NEGATIVE
Staphylococcus aureus: POSITIVE — AB

## 2021-06-12 LAB — LIPASE, BLOOD: Lipase: 33 U/L (ref 11–51)

## 2021-06-12 SURGERY — LAPAROSCOPIC CHOLECYSTECTOMY WITH INTRAOPERATIVE CHOLANGIOGRAM
Anesthesia: General | Site: Abdomen

## 2021-06-12 MED ORDER — ACETAMINOPHEN 500 MG PO TABS
1000.0000 mg | ORAL_TABLET | Freq: Four times a day (QID) | ORAL | Status: DC
  Administered 2021-06-12 – 2021-06-13 (×2): 1000 mg via ORAL
  Filled 2021-06-12 (×4): qty 2

## 2021-06-12 MED ORDER — DEXAMETHASONE SODIUM PHOSPHATE 10 MG/ML IJ SOLN
INTRAMUSCULAR | Status: AC
  Filled 2021-06-12: qty 1

## 2021-06-12 MED ORDER — SODIUM CHLORIDE 0.9 % IV SOLN: INTRAVENOUS | Status: DC

## 2021-06-12 MED ORDER — LACTATED RINGERS IR SOLN
Status: DC | PRN
  Administered 2021-06-12: 1000 mL

## 2021-06-12 MED ORDER — ONDANSETRON HCL 4 MG/2ML IJ SOLN
4.0000 mg | Freq: Once | INTRAMUSCULAR | Status: AC | PRN
  Administered 2021-06-12: 4 mg via INTRAVENOUS
  Filled 2021-06-12: qty 2

## 2021-06-12 MED ORDER — DEXAMETHASONE SODIUM PHOSPHATE 10 MG/ML IJ SOLN
INTRAMUSCULAR | Status: DC | PRN
  Administered 2021-06-12: 10 mg via INTRAVENOUS

## 2021-06-12 MED ORDER — TAMSULOSIN HCL 0.4 MG PO CAPS
0.4000 mg | ORAL_CAPSULE | Freq: Every day | ORAL | Status: DC
  Administered 2021-06-13: 0.4 mg via ORAL
  Filled 2021-06-12: qty 1

## 2021-06-12 MED ORDER — ONDANSETRON HCL 4 MG/2ML IJ SOLN
INTRAMUSCULAR | Status: AC
  Filled 2021-06-12: qty 2

## 2021-06-12 MED ORDER — IOHEXOL 300 MG/ML  SOLN
INTRAMUSCULAR | Status: DC | PRN
  Administered 2021-06-12: 10 mL

## 2021-06-12 MED ORDER — HYDROMORPHONE HCL 1 MG/ML IJ SOLN
1.0000 mg | Freq: Once | INTRAMUSCULAR | Status: AC
  Administered 2021-06-12: 1 mg via INTRAVENOUS
  Filled 2021-06-12: qty 1

## 2021-06-12 MED ORDER — FENTANYL CITRATE (PF) 100 MCG/2ML IJ SOLN
INTRAMUSCULAR | Status: DC | PRN
  Administered 2021-06-12 (×2): 50 ug via INTRAVENOUS
  Administered 2021-06-12: 100 ug via INTRAVENOUS

## 2021-06-12 MED ORDER — SODIUM CHLORIDE 0.9 % IV BOLUS
1000.0000 mL | Freq: Once | INTRAVENOUS | Status: AC
  Administered 2021-06-12: 1000 mL via INTRAVENOUS

## 2021-06-12 MED ORDER — ONDANSETRON HCL 4 MG/2ML IJ SOLN
INTRAMUSCULAR | Status: DC | PRN
  Administered 2021-06-12: 4 mg via INTRAVENOUS

## 2021-06-12 MED ORDER — SODIUM CHLORIDE 0.9 % IV SOLN: 2.0000 g | INTRAVENOUS | Status: AC

## 2021-06-12 MED ORDER — ONDANSETRON HCL 4 MG/2ML IJ SOLN
INTRAMUSCULAR | Status: AC
  Administered 2021-06-12: 4 mg via INTRAVENOUS
  Filled 2021-06-12: qty 2

## 2021-06-12 MED ORDER — FENTANYL CITRATE (PF) 100 MCG/2ML IJ SOLN
INTRAMUSCULAR | Status: AC
  Filled 2021-06-12: qty 2

## 2021-06-12 MED ORDER — PROPOFOL 10 MG/ML IV BOLUS
INTRAVENOUS | Status: AC
  Filled 2021-06-12: qty 20

## 2021-06-12 MED ORDER — ACETAMINOPHEN 500 MG PO TABS: 1000.0000 mg | ORAL_TABLET | Freq: Once | ORAL | Status: AC

## 2021-06-12 MED ORDER — HYDROMORPHONE HCL 1 MG/ML IJ SOLN
1.0000 mg | INTRAMUSCULAR | Status: DC | PRN
  Administered 2021-06-12 (×2): 1 mg via INTRAVENOUS
  Filled 2021-06-12 (×3): qty 1

## 2021-06-12 MED ORDER — SENNOSIDES-DOCUSATE SODIUM 8.6-50 MG PO TABS: 1.0000 | ORAL_TABLET | Freq: Every evening | ORAL | Status: DC | PRN

## 2021-06-12 MED ORDER — CEFAZOLIN SODIUM-DEXTROSE 2-4 GM/100ML-% IV SOLN
2.0000 g | INTRAVENOUS | Status: AC
  Administered 2021-06-12: 2 g via INTRAVENOUS
  Filled 2021-06-12 (×2): qty 100

## 2021-06-12 MED ORDER — ROCURONIUM BROMIDE 10 MG/ML (PF) SYRINGE
PREFILLED_SYRINGE | INTRAVENOUS | Status: DC | PRN
  Administered 2021-06-12: 70 mg via INTRAVENOUS
  Administered 2021-06-12: 20 mg via INTRAVENOUS

## 2021-06-12 MED ORDER — LACTATED RINGERS IV SOLN: INTRAVENOUS | Status: DC

## 2021-06-12 MED ORDER — SERTRALINE HCL 50 MG PO TABS
150.0000 mg | ORAL_TABLET | Freq: Every day | ORAL | Status: DC
  Administered 2021-06-13: 150 mg via ORAL
  Filled 2021-06-12: qty 1

## 2021-06-12 MED ORDER — PROPOFOL 10 MG/ML IV BOLUS
INTRAVENOUS | Status: DC | PRN
  Administered 2021-06-12: 150 mg via INTRAVENOUS

## 2021-06-12 MED ORDER — ROCURONIUM BROMIDE 10 MG/ML (PF) SYRINGE
PREFILLED_SYRINGE | INTRAVENOUS | Status: AC
  Filled 2021-06-12: qty 10

## 2021-06-12 MED ORDER — SIMETHICONE 80 MG PO CHEW: 40.0000 mg | CHEWABLE_TABLET | Freq: Four times a day (QID) | ORAL | Status: DC | PRN

## 2021-06-12 MED ORDER — CHLORHEXIDINE GLUCONATE CLOTH 2 % EX PADS
6.0000 | MEDICATED_PAD | Freq: Once | CUTANEOUS | Status: AC
  Administered 2021-06-12: 6 via TOPICAL

## 2021-06-12 MED ORDER — FINASTERIDE 5 MG PO TABS
5.0000 mg | ORAL_TABLET | Freq: Every day | ORAL | Status: DC
  Administered 2021-06-13: 5 mg via ORAL
  Filled 2021-06-12: qty 1

## 2021-06-12 MED ORDER — FENTANYL CITRATE PF 50 MCG/ML IJ SOSY: 25.0000 ug | PREFILLED_SYRINGE | INTRAMUSCULAR | Status: DC | PRN

## 2021-06-12 MED ORDER — METHOCARBAMOL 500 MG PO TABS: 500.0000 mg | ORAL_TABLET | Freq: Four times a day (QID) | ORAL | Status: DC | PRN

## 2021-06-12 MED ORDER — DIPHENHYDRAMINE HCL 25 MG PO CAPS: 25.0000 mg | ORAL_CAPSULE | Freq: Four times a day (QID) | ORAL | Status: DC | PRN

## 2021-06-12 MED ORDER — SUGAMMADEX SODIUM 200 MG/2ML IV SOLN
INTRAVENOUS | Status: DC | PRN
  Administered 2021-06-12: 200 mg via INTRAVENOUS

## 2021-06-12 MED ORDER — BUPIVACAINE-EPINEPHRINE (PF) 0.25% -1:200000 IJ SOLN
INTRAMUSCULAR | Status: AC
  Filled 2021-06-12: qty 30

## 2021-06-12 MED ORDER — ONDANSETRON 4 MG PO TBDP: 4.0000 mg | ORAL_TABLET | Freq: Four times a day (QID) | ORAL | Status: DC | PRN

## 2021-06-12 MED ORDER — ONDANSETRON HCL 4 MG/2ML IJ SOLN
4.0000 mg | Freq: Four times a day (QID) | INTRAMUSCULAR | Status: DC | PRN
  Filled 2021-06-12: qty 2

## 2021-06-12 MED ORDER — MUPIROCIN 2 % EX OINT
1.0000 "application " | TOPICAL_OINTMENT | Freq: Two times a day (BID) | CUTANEOUS | Status: DC
  Administered 2021-06-12 – 2021-06-13 (×2): 1 via NASAL
  Filled 2021-06-12: qty 22

## 2021-06-12 MED ORDER — PANTOPRAZOLE SODIUM 40 MG PO TBEC
40.0000 mg | DELAYED_RELEASE_TABLET | Freq: Every day | ORAL | Status: DC
  Administered 2021-06-13: 40 mg via ORAL
  Filled 2021-06-12: qty 1

## 2021-06-12 MED ORDER — MIDAZOLAM HCL 2 MG/2ML IJ SOLN
INTRAMUSCULAR | Status: AC
  Filled 2021-06-12: qty 2

## 2021-06-12 MED ORDER — OXYCODONE HCL 5 MG PO TABS
5.0000 mg | ORAL_TABLET | ORAL | Status: DC | PRN
  Administered 2021-06-13: 5 mg via ORAL
  Filled 2021-06-12: qty 1

## 2021-06-12 MED ORDER — SODIUM CHLORIDE 0.9 % IV SOLN
25.0000 mg | Freq: Four times a day (QID) | INTRAVENOUS | Status: DC | PRN
  Administered 2021-06-12: 25 mg via INTRAVENOUS
  Filled 2021-06-12 (×2): qty 1

## 2021-06-12 MED ORDER — LIP MEDEX EX OINT
TOPICAL_OINTMENT | CUTANEOUS | Status: AC
  Filled 2021-06-12: qty 14

## 2021-06-12 MED ORDER — METOPROLOL TARTRATE 5 MG/5ML IV SOLN: 5.0000 mg | Freq: Four times a day (QID) | INTRAVENOUS | Status: DC | PRN

## 2021-06-12 MED ORDER — ONDANSETRON HCL 4 MG/2ML IJ SOLN
4.0000 mg | Freq: Once | INTRAMUSCULAR | Status: AC
  Administered 2021-06-12: 4 mg via INTRAVENOUS
  Filled 2021-06-12: qty 2

## 2021-06-12 MED ORDER — SODIUM CHLORIDE 0.9 % IV SOLN
2.0000 g | INTRAVENOUS | Status: DC
  Administered 2021-06-12: 2 g via INTRAVENOUS
  Filled 2021-06-12: qty 20

## 2021-06-12 MED ORDER — ENOXAPARIN SODIUM 40 MG/0.4ML IJ SOSY: 40.0000 mg | PREFILLED_SYRINGE | INTRAMUSCULAR | Status: DC

## 2021-06-12 MED ORDER — BISACODYL 5 MG PO TBEC: 5.0000 mg | DELAYED_RELEASE_TABLET | Freq: Every day | ORAL | Status: DC | PRN

## 2021-06-12 MED ORDER — DIPHENHYDRAMINE HCL 50 MG/ML IJ SOLN: 25.0000 mg | Freq: Four times a day (QID) | INTRAMUSCULAR | Status: DC | PRN

## 2021-06-12 MED ORDER — MELATONIN 3 MG PO TABS: 3.0000 mg | ORAL_TABLET | Freq: Every evening | ORAL | Status: DC | PRN

## 2021-06-12 MED ORDER — LIDOCAINE 2% (20 MG/ML) 5 ML SYRINGE
INTRAMUSCULAR | Status: DC | PRN
  Administered 2021-06-12: 80 mg via INTRAVENOUS

## 2021-06-12 SURGICAL SUPPLY — 41 items
ADH SKN CLS APL DERMABOND .7 (GAUZE/BANDAGES/DRESSINGS) ×1
APL PRP STRL LF DISP 70% ISPRP (MISCELLANEOUS) ×2
APPLIER CLIP ROT 10 11.4 M/L (STAPLE) ×2
APR CLP MED LRG 11.4X10 (STAPLE) ×1
BAG COUNTER SPONGE SURGICOUNT (BAG) IMPLANT
BAG SPEC RTRVL LRG 6X4 10 (ENDOMECHANICALS) ×1
BAG SPNG CNTER NS LX DISP (BAG)
CABLE HIGH FREQUENCY MONO STRZ (ELECTRODE) ×2 IMPLANT
CHLORAPREP W/TINT 26 (MISCELLANEOUS) ×4 IMPLANT
CLIP APPLIE ROT 10 11.4 M/L (STAPLE) ×1 IMPLANT
COVER MAYO STAND STRL (DRAPES) ×2 IMPLANT
COVER SURGICAL LIGHT HANDLE (MISCELLANEOUS) ×2 IMPLANT
DECANTER SPIKE VIAL GLASS SM (MISCELLANEOUS) ×2 IMPLANT
DERMABOND ADVANCED (GAUZE/BANDAGES/DRESSINGS) ×1
DERMABOND ADVANCED .7 DNX12 (GAUZE/BANDAGES/DRESSINGS) IMPLANT
DRAPE C-ARM 42X120 X-RAY (DRAPES) ×2 IMPLANT
ELECT REM PT RETURN 15FT ADLT (MISCELLANEOUS) ×2 IMPLANT
GAUZE SPONGE 2X2 8PLY STRL LF (GAUZE/BANDAGES/DRESSINGS) ×1 IMPLANT
GLOVE SURG SYN 7.5  E (GLOVE) ×4
GLOVE SURG SYN 7.5 E (GLOVE) ×2 IMPLANT
GLOVE SURG SYN 7.5 PF PI (GLOVE) ×2 IMPLANT
GOWN STRL REUS W/TWL XL LVL3 (GOWN DISPOSABLE) ×4 IMPLANT
HEMOSTAT SURGICEL 4X8 (HEMOSTASIS) IMPLANT
KIT BASIN OR (CUSTOM PROCEDURE TRAY) ×2 IMPLANT
KIT TURNOVER KIT A (KITS) ×2 IMPLANT
PENCIL SMOKE EVACUATOR (MISCELLANEOUS) ×1 IMPLANT
POUCH SPECIMEN RETRIEVAL 10MM (ENDOMECHANICALS) ×2 IMPLANT
SCISSORS LAP 5X35 DISP (ENDOMECHANICALS) ×2 IMPLANT
SET CHOLANGIOGRAPH MIX (MISCELLANEOUS) ×2 IMPLANT
SET IRRIG TUBING LAPAROSCOPIC (IRRIGATION / IRRIGATOR) ×2 IMPLANT
SET TUBE SMOKE EVAC HIGH FLOW (TUBING) ×1 IMPLANT
SLEEVE XCEL OPT CAN 5 100 (ENDOMECHANICALS) ×2 IMPLANT
SPONGE GAUZE 2X2 STER 10/PKG (GAUZE/BANDAGES/DRESSINGS) ×1
STRIP CLOSURE SKIN 1/2X4 (GAUZE/BANDAGES/DRESSINGS) IMPLANT
SUT MNCRL AB 4-0 PS2 18 (SUTURE) ×2 IMPLANT
TOWEL OR 17X26 10 PK STRL BLUE (TOWEL DISPOSABLE) ×2 IMPLANT
TOWEL OR NON WOVEN STRL DISP B (DISPOSABLE) ×2 IMPLANT
TRAY LAPAROSCOPIC (CUSTOM PROCEDURE TRAY) ×2 IMPLANT
TROCAR BLADELESS OPT 5 100 (ENDOMECHANICALS) ×2 IMPLANT
TROCAR XCEL BLUNT TIP 100MML (ENDOMECHANICALS) ×2 IMPLANT
TROCAR XCEL NON-BLD 11X100MML (ENDOMECHANICALS) ×2 IMPLANT

## 2021-06-12 NOTE — Discharge Instructions (Signed)
[facility] ______[facility] °LAPAROSCOPIC SURGERY: POST OP INSTRUCTIONS °Always review your discharge instruction sheet given to you by the facility where your surgery was performed. °IF YOU HAVE DISABILITY OR FAMILY LEAVE FORMS, YOU MUST BRING THEM TO THE OFFICE FOR PROCESSING.   °DO NOT GIVE THEM TO YOUR DOCTOR. ° °A prescription for pain medication may be given to you upon discharge.  Take your pain medication as prescribed, if needed.  If narcotic pain medicine is not needed, then you may take acetaminophen (Tylenol) or ibuprofen (Advil) as needed. °Take your usually prescribed medications unless otherwise directed. °If you need a refill on your pain medication, please contact your pharmacy.  They will contact our office to request authorization. Prescriptions will not be filled after 5pm or on week-ends. °You should follow a light diet the first few days after arrival home, such as soup and crackers, etc.  Be sure to include lots of fluids daily. °Most patients will experience some swelling and bruising in the area of the incisions.  Ice packs will help.  Swelling and bruising can take several days to resolve.  °It is common to experience some constipation if taking pain medication after surgery.  Increasing fluid intake and taking a stool softener (such as Colace) will usually help or prevent this problem from occurring.  A mild laxative (Milk of Magnesia or Miralax) should be taken according to package instructions if there are no bowel movements after 48 hours. °Unless discharge instructions indicate otherwise, you may remove your bandages 24-48 hours after surgery, and you may shower at that time.  You may have steri-strips (small skin tapes) in place directly over the incision.  These strips should be left on the skin for 7-10 days.  If your surgeon used skin glue on the incision, you may shower in 24 hours.  The glue will flake off over the next 2-3 weeks.  Any sutures or staples will be  removed at the office during your follow-up visit. °ACTIVITIES:  You may resume regular (light) daily activities beginning the next day--such as daily self-care, walking, climbing stairs--gradually increasing activities as tolerated.  You may have sexual intercourse when it is comfortable.  Refrain from any heavy lifting or straining until approved by your doctor. °You may drive when you are no longer taking prescription pain medication, you can comfortably wear a seatbelt, and you can safely maneuver your car and apply brakes. °RETURN TO WORK:  __________________________________________________________ °You should see your doctor in the office for a follow-up appointment approximately 2-3 weeks after your surgery.  Make sure that you call for this appointment within a day or two after you arrive home to insure a convenient appointment time. °OTHER INSTRUCTIONS: __________________________________________________________________________________________________________________________ __________________________________________________________________________________________________________________________ °WHEN TO CALL YOUR DOCTOR: °Fever over 101.0 °Inability to urinate °Continued bleeding from incision. °Increased pain, redness, or drainage from the incision. °Increasing abdominal pain ° °The clinic staff is available to answer your questions during regular business hours.  Please don’t hesitate to call and ask to speak to one of the nurses for clinical concerns.  If you have a medical emergency, go to the nearest emergency room or call 911.  A surgeon from [facility] is always on call at the hospital. °[address], [address], [place], [place]  [address] ? [address] [address], [place], [place]   [address] °[phone] ? [phone] ? FAX [phone] °Web site: [email or web address] ° ° ° °Managing Your Pain After Surgery Without Opioids ° ° ° °Thank you for   participating in our program to  help patients manage their pain after surgery without opioids. This is part of our effort to provide you with the best care possible, without exposing you or your family to the risk that opioids pose. ° °What pain can I expect after surgery? °You can expect to have some pain after surgery. This is normal. The pain is typically worse the day after surgery, and quickly begins to get better. °Many studies have found that many patients are able to manage their pain after surgery with Over-the-Counter (OTC) medications such as Tylenol and Motrin. If you have a condition that does not allow you to take Tylenol or Motrin, notify your surgical team. ° °How will I manage my pain? °The best strategy for controlling your pain after surgery is around the clock pain control with Tylenol (acetaminophen) and Motrin (ibuprofen or Advil). Alternating these medications with each other allows you to maximize your pain control. In addition to Tylenol and Motrin, you can use heating pads or ice packs on your incisions to help reduce your pain. ° °How will I alternate your regular strength over-the-counter pain medication? °You will take a dose of pain medication every three hours. °Start by taking 650 mg of Tylenol (2 pills of 325 mg) °3 hours later take 600 mg of Motrin (3 pills of 200 mg) °3 hours after taking the Motrin take 650 mg of Tylenol °3 hours after that take 600 mg of Motrin. ° ° °- 1 - ° °See example - if your first dose of Tylenol is at 12:00 PM ° ° °12:00 PM Tylenol 650 mg (2 pills of 325 mg)  °3:00 PM Motrin 600 mg (3 pills of 200 mg)  °6:00 PM Tylenol 650 mg (2 pills of 325 mg)  °9:00 PM Motrin 600 mg (3 pills of 200 mg)  °Continue alternating every 3 hours  ° °We recommend that you follow this schedule around-the-clock for at least 3 days after surgery, or until you feel that it is no longer needed. Use the table on the last page of this handout to keep track of the medications you are taking. °Important: °Do not take  more than 3000mg of Tylenol or 3200mg of Motrin in a 24-hour period. °Do not take ibuprofen/Motrin if you have a history of bleeding stomach ulcers, severe kidney disease, &/or actively taking a blood thinner ° °What if I still have pain? °If you have pain that is not controlled with the over-the-counter pain medications (Tylenol and Motrin or Advil) you might have what we call “breakthrough” pain. You will receive a prescription for a small amount of an opioid pain medication such as Oxycodone, Tramadol, or Tylenol with Codeine. Use these opioid pills in the first 24 hours after surgery if you have breakthrough pain. Do not take more than 1 pill every 4-6 hours. ° °If you still have uncontrolled pain after using all opioid pills, don't hesitate to call our staff using the number provided. We will help make sure you are managing your pain in the best way possible, and if necessary, we can provide a prescription for additional pain medication. ° ° °Day 1   ° °Time  °Name of Medication Number of pills taken  °Amount of Acetaminophen  °Pain Level  ° °Comments  °AM PM       °AM PM       °AM PM       °AM PM       °AM PM       °  AM PM       °AM PM       °AM PM       °Total Daily amount of Acetaminophen °Do not take more than  3,000 mg per day    ° ° °Day 2   ° °Time  °Name of Medication Number of pills °taken  °Amount of Acetaminophen  °Pain Level  ° °Comments  °AM PM       °AM PM       °AM PM       °AM PM       °AM PM       °AM PM       °AM PM       °AM PM       °Total Daily amount of Acetaminophen °Do not take more than  3,000 mg per day    ° ° °Day 3   ° °Time  °Name of Medication Number of pills taken  °Amount of Acetaminophen  °Pain Level  ° °Comments  °AM PM       °AM PM       °AM PM       °AM PM       ° ° ° °AM PM       °AM PM       °AM PM       °AM PM       °Total Daily amount of Acetaminophen °Do not take more than  3,000 mg per day    ° ° °Day 4   ° °Time  °Name of Medication Number of pills taken  °Amount of  Acetaminophen  °Pain Level  ° °Comments  °AM PM       °AM PM       °AM PM       °AM PM       °AM PM       °AM PM       °AM PM       °AM PM       °Total Daily amount of Acetaminophen °Do not take more than  3,000 mg per day    ° ° °Day 5   ° °Time  °Name of Medication Number °of pills taken  °Amount of Acetaminophen  °Pain Level  ° °Comments  °AM PM       °AM PM       °AM PM       °AM PM       °AM PM       °AM PM       °AM PM       °AM PM       °Total Daily amount of Acetaminophen °Do not take more than  3,000 mg per day    ° ° ° °Day 6   ° °Time  °Name of Medication Number of pills °taken  °Amount of Acetaminophen  °Pain Level  °Comments  °AM PM       °AM PM       °AM PM       °AM PM       °AM PM       °AM PM       °AM PM       °AM PM       °Total Daily amount of Acetaminophen °Do not take more than  3,000 mg per day    ° ° °Day 7   ° °Time  °  Name of Medication Number of pills taken  °Amount of Acetaminophen  °Pain Level  ° °Comments  °AM PM       °AM PM       °AM PM       °AM PM       °AM PM       °AM PM       °AM PM       °AM PM       °Total Daily amount of Acetaminophen °Do not take more than  3,000 mg per day    ° ° ° ° °For additional information about how and where to safely dispose of unused opioid °medications - https://www.morepowerfulnc.org ° °Disclaimer: This document contains information and/or instructional materials adapted from [facility] for the typical patient with your condition. It does not replace medical advice from your health care provider because your experience may differ from that of the °typical patient. Talk to your health care provider if you have any questions about this °document, your condition or your treatment plan. °Adapted from [facility] ° °

## 2021-06-12 NOTE — Anesthesia Preprocedure Evaluation (Addendum)
Anesthesia Evaluation  Patient identified by MRN, date of birth, ID band Patient awake    Reviewed: Allergy & Precautions, NPO status , Patient's Chart, lab work & pertinent test results  Airway Mallampati: III  TM Distance: >3 FB Neck ROM: Full    Dental  (+) Chipped, Dental Advisory Given, Missing,    Pulmonary neg pulmonary ROS,    Pulmonary exam normal breath sounds clear to auscultation       Cardiovascular negative cardio ROS Normal cardiovascular exam Rhythm:Regular Rate:Normal     Neuro/Psych  Headaches, PSYCHIATRIC DISORDERS Anxiety    GI/Hepatic Neg liver ROS, GERD  ,  Endo/Other  negative endocrine ROS  Renal/GU negative Renal ROS  negative genitourinary   Musculoskeletal negative musculoskeletal ROS (+)   Abdominal   Peds  Hematology negative hematology ROS (+)   Anesthesia Other Findings   Reproductive/Obstetrics                            Anesthesia Physical Anesthesia Plan  ASA: 2  Anesthesia Plan: General   Post-op Pain Management:    Induction: Intravenous  PONV Risk Score and Plan: Midazolam, Dexamethasone and Ondansetron  Airway Management Planned: Oral ETT  Additional Equipment:   Intra-op Plan:   Post-operative Plan: Extubation in OR  Informed Consent: I have reviewed the patients History and Physical, chart, labs and discussed the procedure including the risks, benefits and alternatives for the proposed anesthesia with the patient or authorized representative who has indicated his/her understanding and acceptance.     Dental advisory given  Plan Discussed with: CRNA  Anesthesia Plan Comments:         Anesthesia Quick Evaluation

## 2021-06-12 NOTE — ED Triage Notes (Signed)
Patient arrives via EMS from home with complaint of RLQ pain that woke him up from his sleep around 0130. Pt endorses N/V/D. 250 mcg fentanyl en route.

## 2021-06-12 NOTE — Anesthesia Procedure Notes (Signed)
Procedure Name: Intubation Date/Time: 06/12/2021 2:44 PM Performed by: [place] Lyris Hitchman C, CRNA Pre-anesthesia Checklist: Patient identified, Emergency Drugs available, Suction available and Patient being monitored Patient Re-evaluated:Patient Re-evaluated prior to induction Oxygen Delivery Method: Circle system utilized Preoxygenation: Pre-oxygenation with 100% oxygen Induction Type: IV induction Ventilation: Mask ventilation without difficulty Laryngoscope Size: Mac and 4 Grade View: Grade I Tube type: Oral Tube size: 7.5 mm Number of attempts: 1 Airway Equipment and Method: Stylet and Oral airway Placement Confirmation: ETT inserted through vocal cords under direct vision, positive ETCO2 and breath sounds checked- equal and bilateral Secured at: 22 cm Tube secured with: Tape Dental Injury: Teeth and Oropharynx as per pre-operative assessment

## 2021-06-12 NOTE — ED Provider Notes (Signed)
[facility]   CSN: [id number] Arrival date & time: 06/12/21  0341     History Chief Complaint  Patient presents with   RLQ pain     Daniel Petersen is a 60 y.o. male.  HPI 59 year old male presents with severe right-sided abdominal pain.  This started all of a sudden at 1:30 AM and awoke him from sleep.  No pain or discomfort or any symptoms when he went to bed.  He has not had a fever or back pain.  The pain is primarily in his right upper quadrant.  It feels like a pressure and is now 9-1/2 or 10.  He has received multiple doses of fentanyl by EMS.  Has gotten some transient relief.  He has had multiple episodes of vomiting and diarrhea without blood.  No urinary symptoms. No prior abdominal surgeries.   Past Medical History:  Diagnosis Date   Anxiety    BPH (benign prostatic hyperplasia)    Enlarged prostate on rectal examination 2017   GERD (gastroesophageal reflux disease)    Migraine    CURRENTLY ENROLLED IN A STUDY BY [facility]   Snoring    s/p 3 sleep study: all neg , last 2011   Stuttering    Urinary urgency 2017    Patient Active Problem List   Diagnosis Date Noted   Soft tissue mass 04/26/2017   PCP NOTES >>> 06/28/2015   Annual physical exam 07/17/2012   BPH (benign prostatic hyperplasia) 07/14/2011   Migraine    Snoring    Anxiety     Past Surgical History:  Procedure Laterality Date   MASS EXCISION N/A 04/29/2017   Procedure: 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 Authorizing Provider  finasteride (PROSCAR) 5 MG tablet Take 5 mg by mouth daily.   Yes [provider]  omeprazole (PRILOSEC) 40 MG capsule Take 1 capsule (40 mg total) by mouth daily. 03/17/21  Yes Paz, Nolon Rod, MD  sertraline (ZOLOFT) 100 MG tablet Take 1.5 tablets (150 mg total) by mouth daily. 03/17/21  Yes Paz, Nolon Rod, MD  tamsulosin (FLOMAX) 0.4 MG CAPS capsule Take 0.4 mg by mouth daily.   Yes [provider]  zolmitriptan (ZOMIG) 5 MG tablet Take 5 mg by mouth as needed.   Yes [provider]  Erenumab-aooe 70 MG/ML SOAJ Inject into the skin. Patient not taking: Reported on 06/12/2021    [provider]    Allergies    Patient has no known allergies.  Review of Systems   Review of Systems  Constitutional:  Negative for fever.  Gastrointestinal:  Positive for abdominal pain, diarrhea and vomiting.  Genitourinary:  Negative for dysuria and hematuria.  Musculoskeletal:  Negative for back pain.  All other systems reviewed and are negative.  Physical Exam Updated Vital Signs BP (!) 144/90   Pulse 82   Temp 97.7 F (36.5 C) (Oral)   Resp 12   SpO2 96%   Physical Exam Vitals and nursing note reviewed.  Constitutional:      Appearance: He is well-developed.  HENT:     Head: Normocephalic and atraumatic.     Right Ear: External ear normal.     Left Ear: External ear normal.     Nose: Nose normal.  Eyes:     General:        Right eye: No discharge.        Left eye: No discharge.  Cardiovascular:     Rate and Rhythm: Normal rate and regular rhythm.     Heart sounds: Normal heart sounds.  Pulmonary:     Effort: Pulmonary effort is normal.     Breath sounds: Normal breath sounds.  Abdominal:     Palpations: Abdomen is soft.     Tenderness: There is abdominal  tenderness in the right upper quadrant. There is no right CVA tenderness or left CVA tenderness.  Musculoskeletal:     Cervical back: Neck supple.  Skin:    General: Skin is warm and dry.  Neurological:     Mental Status: He is alert.  Psychiatric:        Mood and Affect: Mood is not anxious.    ED Results / Procedures / Treatments   Labs (all labs ordered are listed, but only abnormal results are displayed) Labs Reviewed  COMPREHENSIVE METABOLIC PANEL - Abnormal; Notable for the following components:      Result Value   Glucose, Bld 135 (*)    BUN 28 (*)    All other components within normal limits  CBC WITH DIFFERENTIAL/PLATELET - Abnormal; Notable for the following components:   WBC 12.5 (*)    Neutro Abs 10.8 (*)    All other components within normal limits  RESP PANEL BY RT-PCR (FLU A&B, COVID) ARPGX2  LIPASE, BLOOD  URINALYSIS, ROUTINE W REFLEX MICROSCOPIC    EKG None  Radiology US Abdomen Limited RUQ (LIVER/GB)  Result Date: 06/12/2021 CLINICAL DATA:  Right-sided abdominal pain EXAM: ULTRASOUND ABDOMEN LIMITED RIGHT UPPER QUADRANT COMPARISON:  None. FINDINGS: Gallbladder: Sludge. 1.6 cm gallstone. No wall thickening visualized. No sonographic Murphy sign noted by sonographer. Common bile duct: Suboptimally visualized.  Diameter: 3 mm Liver: No focal lesion identified. Within normal limits in parenchymal echogenicity. Portal vein is patent on color Doppler imaging with normal direction of blood flow towards the liver. Other: None. IMPRESSION: Cholelithiasis and sludge. No sonographic evidence of acute cholecystitis. Electronically Signed   By: Guadlupe Spanish M.D.   On: 06/12/2021 05:07    Procedures Procedures   Medications Ordered in ED Medications  sodium chloride 0.9 % bolus 1,000 mL (0 mLs Intravenous Stopped 06/12/21 0553)  HYDROmorphone (DILAUDID) injection 1 mg (1 mg Intravenous Given 06/12/21 0443)  ondansetron (ZOFRAN) injection 4 mg (4 mg Intravenous Given  06/12/21 0443)  HYDROmorphone (DILAUDID) injection 1 mg (1 mg Intravenous Given 06/12/21 0552)  ondansetron (ZOFRAN) injection 4 mg (4 mg Intravenous Given 06/12/21 0551)    ED Course  I have reviewed the triage vital signs and the nursing notes.  Pertinent labs & imaging results that were available during my care of the patient were reviewed by me and considered in my  medical decision making (see chart for details).    MDM Rules/Calculators/A&P                           Patient appears to have symptomatic cholelithiasis.  Given the size of his gallstone in combination with poorly controlled pain, I think he will need cholecystectomy as well as admission.  I discussed with Dr. Carolynne Edouard.  The morning surgical team will see him.  He advises to hold on antibiotics given its not clearly cholecystitis and general surgery will consult. Care to Dr. Freida Busman Final Clinical Impression(s) / ED Diagnoses Final diagnoses:  Right sided abdominal pain  Calculus of gallbladder without cholecystitis without obstruction    Rx / DC Orders ED Discharge Orders     None        Pricilla Loveless, MD 06/12/21 [phone]

## 2021-06-12 NOTE — Op Note (Signed)
Procedure Note  Pre-operative Diagnosis:  chronic cholecystitis, cholelithiasis, unrelenting biliary colic  Post-operative Diagnosis:  acute gangrenous cholecystitis, cholelithiasis  Surgeon:  Darnell Level, MD  Assistant:  none   Procedure:  Laparoscopic cholecystectomy with intra-operative cholangiography  Anesthesia:  General  Estimated Blood Loss:  50 cc  Drains: none         Specimen: gallbladder to pathology  Indications:  60 year old male with medical history significant for migraines, snoring, anxiety, BPH who presented to the [facility] emergency department secondary to acute onset right upper quadrant abdominal pain, nausea, emesis.  He states pain is severe and minimally improved by pain medications given in the emergency department.  He had several episodes of emesis at home but none since arrival to the ED.  He continues to have nausea.  He has never experienced similar episodes prior.  He states pain is currently 10 out of 10. Work-up in the ED significant for minimally elevated WBC at 12.5, LFTs WNL, afebrile, vital signs stable.  Ultrasound shows cholelithiasis without findings of acute cholecystitis. Patient now comes to surgery for cholecystectomy.  Procedure description: The patient was seen in the pre-op holding area. The risks, benefits, complications, treatment options, and expected outcomes were previously discussed with the patient. The patient agreed with the proposed plan and has signed the informed consent form.  The patient was transported to operating room #4 at the [facility]. The patient was placed in the supine position on the operating room table. Following induction of general anesthesia, the abdomen was prepped and draped in the usual aseptic fashion.  An incision was made in the skin near the umbilicus. The midline fascia was incised and the peritoneal cavity was entered and a Hasson cannula was introduced under direct vision. The cannula was  secured with a 0-Vicryl pursestring suture. Pneumoperitoneum was established with carbon dioxide. Additional cannulae were introduced under direct vision along the right costal margin in the midline, mid-clavicular line, and anterior axillary line.   The gallbladder was identified and appeared tense, inflamed, with early necrosis of the fundus. The fundus was grasped and retracted cephalad. Adhesions were taken down bluntly and the electrocautery was utilized as needed, taking care not to involve any adjacent structures. The infundibulum was grasped and retracted laterally, exposing the peritoneum overlying the triangle of Calot. The peritoneum was incised and structures exposed with blunt dissection. The cystic duct was clearly identified, bluntly dissected circumferentially, and clipped at the neck of the gallbladder.  An incision was made in the cystic duct and the cholangiogram catheter introduced. The catheter was secured using an ligaclip.  Real-time cholangiography was performed using C-arm fluoroscopy.  There was rapid filling of a normal caliber common bile duct.  There was reflux of contrast into the left and right hepatic ductal systems.  There was free flow distally into the duodenum without filling defect or obstruction.  The catheter was removed from the peritoneal cavity.  The cystic duct was then ligated with ligaclips and divided. The cystic artery was identified, dissected circumferentially, ligated with ligaclips, and divided.  The gallbladder was dissected away from the gallbladder bed using the electrocautery for hemostasis. The gallbladder was completely removed from the liver and placed into an endocatch bag. The gallbladder was removed in the endocatch bag through the umbilical port site and submitted to pathology for review.  The right upper quadrant was irrigated and the gallbladder bed was inspected. Hemostasis was achieved with the electrocautery.  Cannulae were removed under  direct  vision and good hemostasis was noted. Pneumoperitoneum was released and the majority of the carbon dioxide evacuated. The umbilical wound was irrigated and the fascia was then closed with the pursestring suture.  Local anesthetic was infiltrated at all port sites. Skin incisions were closed with 4-0 Monocril subcuticular sutures and Dermabond was applied.  Instrument, sponge, and needle counts were correct at the conclusion of the case.  The patient was awakened from anesthesia and brought to the recovery room in stable condition.  The patient tolerated the procedure well.   Darnell Level, MD [facility] Office: [phone]

## 2021-06-12 NOTE — Transfer of Care (Signed)
Immediate Anesthesia Transfer of Care Note  Patient: Daniel Petersen  Procedure(s) Performed: LAPAROSCOPIC CHOLECYSTECTOMY WITH INTRAOPERATIVE CHOLANGIOGRAM (Abdomen)  Patient Location: PACU  Anesthesia Type:General  Level of Consciousness: awake and drowsy  Airway & Oxygen Therapy: Patient Spontanous Breathing and Patient connected to face mask oxygen  Post-op Assessment: Report given to RN and Post -op Vital signs reviewed and stable  Post vital signs: Reviewed and stable  Last Vitals:  Vitals Value Taken Time  BP 140/78 06/12/21 1608  Temp    Pulse 106 06/12/21 1611  Resp 17 06/12/21 1611  SpO2 96 % 06/12/21 1611  Vitals shown include unvalidated device data.  Last Pain:  Vitals:   06/12/21 1330  TempSrc: Oral  PainSc: 6       Patients Stated Pain Goal: 4 (06/12/21 1330)  Complications: No notable events documented.

## 2021-06-12 NOTE — H&P (Signed)
Daniel Petersen 10-03-1960  [id number].    Requesting MD: Dr. Criss Alvine Chief Complaint/Reason for Consult: biliary colic  HPI:  60 year old male with medical history significant for migraines, snoring, anxiety, BPH who presented to the [facility] emergency department secondary to acute onset right upper quadrant abdominal pain, nausea, emesis.  He states pain is severe and minimally improved by pain medications given in the emergency department.  He had several episodes of emesis at home but none since arrival to the ED.  He continues to have nausea.  He has never experienced similar episodes prior.  He states pain is currently 10 out of 10.  ROS otherwise as below.  Work-up in the ED significant for minimally elevated WBC at 12.5, LFTs WNL, afebrile, vital signs stable.  Ultrasound shows cholelithiasis without findings of acute cholecystitis.  General surgery was asked to see given his ongoing symptoms despite pain medications.  Substance use: None Allergies: NKDA Blood thinners: None Past Surgeries: No prior abdominal surgeries  ROS: Review of Systems  Constitutional:  Negative for chills and fever.  Respiratory:  Negative for cough, shortness of breath and wheezing.   Cardiovascular:  Negative for chest pain, palpitations and leg swelling.  Gastrointestinal:  Positive for abdominal pain, nausea and vomiting. Negative for constipation and diarrhea.  Genitourinary: Negative.    Family History  Problem Relation Age of Onset   Stroke Mother 9           Dementia Mother    Diabetes Maternal Grandmother    Colon cancer Neg Hx    Prostate cancer Neg Hx    CAD Neg Hx     Past Medical History:  Diagnosis Date   Anxiety    BPH (benign prostatic hyperplasia)    Enlarged prostate on rectal examination 2017   GERD (gastroesophageal reflux disease)    Migraine    CURRENTLY ENROLLED IN A STUDY BY [facility]   Snoring    s/p 3 sleep study: all neg , last 2011    Stuttering    Urinary urgency 2017    Past Surgical History:  Procedure Laterality Date   MASS EXCISION N/A 04/29/2017   Procedure: 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 He has never used smokeless tobacco. He reports that he does not drink alcohol and does not use drugs.  Allergies: No Known Allergies  (Not in a hospital admission)   Blood pressure (!) 142/91, pulse 76, temperature 97.7 F (36.5 C), temperature source Oral, resp. rate 14, SpO2 91 %. Physical Exam:  General: pleasant, WD, male who is laying in bed in NAD HEENT: head is normocephalic, atraumatic.  Sclera are noninjected.  Pupils equal and round.  Ears and nose without any masses or lesions.  Mouth is pink and moist Heart: regular, rate, and rhythm.  Normal s1,s2. No obvious murmurs, gallops, or rubs noted.  Palpable radial and pedal pulses bilaterally Lungs: CTAB, no wheezes, rhonchi, or rales noted.  Respiratory effort nonlabored Abd: soft, ND, +BS, no masses, hernias, or organomegaly.  No tenderness to palpation on exam MS: all 4 extremities are symmetrical with no cyanosis, clubbing, or edema. Skin: warm and dry with no masses, lesions, or rashes Neuro: Cranial nerves 2-12 grossly intact, sensation is normal throughout Psych: A&Ox3 with  an appropriate affect.   Results for orders placed or performed during the hospital encounter of 06/12/21 (from the past 48 hour(s))  Comprehensive metabolic panel     Status: Abnormal   Collection Time: 06/12/21  4:45 AM  Result Value Ref Range   Sodium 141 135 - 145 mmol/L   Potassium 4.1 3.5 - 5.1 mmol/L   Chloride 108 98 - 111 mmol/L   CO2 25 22 - 32 mmol/L   Glucose, Bld 135 (H) 70 - 99 mg/dL    Comment: Glucose reference range applies only to samples taken after fasting for at least 8  hours.   BUN 28 (H) 6 - 20 mg/dL   Creatinine, Ser 4.26 0.61 - 1.24 mg/dL   Calcium 9.3 8.9 - 83.4 mg/dL   Total Protein 7.5 6.5 - 8.1 g/dL   Albumin 3.9 3.5 - 5.0 g/dL   AST 17 15 - 41 U/L   ALT 15 0 - 44 U/L   Alkaline Phosphatase 72 38 - 126 U/L   Total Bilirubin 0.4 0.3 - 1.2 mg/dL   GFR, Estimated >19 >62 mL/min    Comment: (NOTE) Calculated using the CKD-EPI Creatinine Equation (2021)    Anion gap 8 5 - 15    Comment: Performed at [facility], [address], [place], [place] [address]  Lipase, blood     Status: None   Collection Time: 06/12/21  4:45 AM  Result Value Ref Range   Lipase 33 11 - 51 U/L    Comment: Performed at [facility], [address], [place], [place] [address]  CBC with Differential     Status: Abnormal   Collection Time: 06/12/21  4:45 AM  Result Value Ref Range   WBC 12.5 (H) 4.0 - 10.5 K/uL   RBC 4.71 4.22 - 5.81 MIL/uL   Hemoglobin 14.4 13.0 - 17.0 g/dL   HCT 94.1 74.0 - 81.4 %   MCV 89.4 80.0 - 100.0 fL   MCH 30.6 26.0 - 34.0 pg   MCHC 34.2 30.0 - 36.0 g/dL   RDW 48.1 85.6 - 31.4 %   Platelets 247 150 - 400 K/uL   nRBC 0.0 0.0 - 0.2 %   Neutrophils Relative % 87 %   Neutro Abs 10.8 (H) 1.7 - 7.7 K/uL   Lymphocytes Relative 9 %   Lymphs Abs 1.1 0.7 - 4.0 K/uL   Monocytes Relative 3 %   Monocytes Absolute 0.4 0.1 - 1.0 K/uL   Eosinophils Relative 0 %   Eosinophils Absolute 0.0 0.0 - 0.5 K/uL   Basophils Relative 0 %   Basophils Absolute 0.1 0.0 - 0.1 K/uL   Immature Granulocytes 1 %   Abs Immature Granulocytes 0.07 0.00 - 0.07 K/uL    Comment: Performed at [facility], [address], [place], [place] [address]  Resp Panel by RT-PCR (Flu A&B, Covid) Nasopharyngeal Swab     Status: None   Collection Time: 06/12/21  5:54 AM   Specimen: Nasopharyngeal Swab; Nasopharyngeal(NP) swabs in vial transport medium  Result Value Ref Range   SARS Coronavirus 2 by RT PCR NEGATIVE NEGATIVE     Comment: (NOTE) SARS-CoV-2 target nucleic acids are NOT DETECTED.  The SARS-CoV-2 RNA is generally detectable in upper respiratory specimens during the acute phase of infection. The lowest concentration of SARS-CoV-2 viral copies this assay can detect is 138 copies/mL. A negative result does not preclude SARS-Cov-2 infection and should not be used as the sole  basis for treatment or other patient management decisions. A negative result may occur with  improper specimen collection/handling, submission of specimen other than nasopharyngeal swab, presence of viral mutation(s) within the areas targeted by this assay, and inadequate number of viral copies(<138 copies/mL). A negative result must be combined with clinical observations, patient history, and epidemiological information. The expected result is Negative.  Fact Sheet for Patients:  BloggerCourse.com  Fact Sheet for Healthcare Providers:  SeriousBroker.it  This test is no t yet approved or cleared by the [place] FDA and  has been authorized for detection and/or diagnosis of SARS-CoV-2 by FDA under an Emergency Use Authorization (EUA). This EUA will remain  in effect (meaning this test can be used) for the duration of the COVID-19 declaration under Section 564(b)(1) of the Act, 21 U.S.C.section 360bbb-3(b)(1), unless the authorization is terminated  or revoked sooner.       Influenza A by PCR NEGATIVE NEGATIVE   Influenza B by PCR NEGATIVE NEGATIVE    Comment: (NOTE) The Xpert Xpress SARS-CoV-2/FLU/RSV plus assay is intended as an aid in the diagnosis of influenza from Nasopharyngeal swab specimens and should not be used as a sole basis for treatment. Nasal washings and aspirates are unacceptable for Xpert Xpress SARS-CoV-2/FLU/RSV testing.  Fact Sheet for Patients: BloggerCourse.com  Fact Sheet for Healthcare  Providers: SeriousBroker.it  This test is not yet approved or cleared by the [place] FDA and has been authorized for detection and/or diagnosis of SARS-CoV-2 by FDA under an Emergency Use Authorization (EUA). This EUA will remain in effect (meaning this test can be used) for the duration of the COVID-19 declaration under Section 564(b)(1) of the Act, 21 U.S.C. section 360bbb-3(b)(1), unless the authorization is terminated or revoked.  Performed at [facility], [address] [place], [place] [address]    US Abdomen Limited RUQ (LIVER/GB)  Result Date: 06/12/2021 CLINICAL DATA:  Right-sided abdominal pain EXAM: ULTRASOUND ABDOMEN LIMITED RIGHT UPPER QUADRANT COMPARISON:  None. FINDINGS: Gallbladder: Sludge. 1.6 cm gallstone. No wall thickening visualized. No sonographic Murphy sign noted by sonographer. Common bile duct: Suboptimally visualized.  Diameter: 3 mm Liver: No focal lesion identified. Within normal limits in parenchymal echogenicity. Portal vein is patent on color Doppler imaging with normal direction of blood flow towards the liver. Other: None. IMPRESSION: Cholelithiasis and sludge. No sonographic evidence of acute cholecystitis. Electronically Signed   By: Guadlupe Spanish M.D.   On: 06/12/2021 05:07      Assessment/Plan Biliary colic without acute cholecystitis Given his ongoing pain despite pain management we will plan for admission to observation and likely laparoscopic cholecystectomy with intraoperative cholangiogram today.  Fortunately he does not have findings of acute cholecystitis and will order preoperative antibiotics only.  Pending surgical findings and timing he may be able to discharge home today versus tomorrow  I have explained the procedure, risks, and aftercare of cholecystectomy.  Risks include but are not limited to bleeding, infection, wound problems, anesthesia, diarrhea, bile leak, injury to common bile  duct/liver/intestine.  He seems to understand and agrees to proceed.   Admit to observation  FEN: NPO, IVF ID: Ceftriaxone periop VTE: SCDs    Eric Form, [facility] 06/12/2021, 8:11 AM Please see Amion for pager number during day hours 7:00am-4:30pm

## 2021-06-13 ENCOUNTER — Encounter (HOSPITAL_COMMUNITY): Payer: Self-pay | Admitting: Surgery

## 2021-06-13 MED ORDER — KETOROLAC TROMETHAMINE 15 MG/ML IJ SOLN: 15.0000 mg | Freq: Once | INTRAMUSCULAR | Status: DC

## 2021-06-13 MED ORDER — SUMATRIPTAN SUCCINATE 50 MG PO TABS
50.0000 mg | ORAL_TABLET | ORAL | Status: DC | PRN
  Administered 2021-06-13 (×2): 50 mg via ORAL
  Filled 2021-06-13 (×5): qty 1

## 2021-06-13 MED ORDER — OXYCODONE HCL 5 MG PO TABS: 5.0000 mg | ORAL_TABLET | ORAL | 0 refills | Status: DC | PRN

## 2021-06-13 MED ORDER — POLYETHYLENE GLYCOL 3350 17 G PO PACK: 17.0000 g | PACK | Freq: Every day | ORAL | Status: DC

## 2021-06-13 MED ORDER — HYDROMORPHONE HCL 1 MG/ML IJ SOLN: 0.5000 mg | INTRAMUSCULAR | Status: DC | PRN

## 2021-06-13 NOTE — Progress Notes (Signed)
Nurse reviewed discharge instructions with pt.  Pt verbalized understanding of discharge instructions, follow up appointments and new medications.  No concerns at time of discharge. 

## 2021-06-13 NOTE — Progress Notes (Signed)
1 Day Post-Op   Subjective/Chief Complaint: Has a migraine otherwise fine   Objective: Vital signs in last 24 hours: Temp:  [97.6 F (36.4 C)-99.4 F (37.4 C)] 97.6 F (36.4 C) (10/01 0536) Pulse Rate:  [69-109] 86 (10/01 0536) Resp:  [14-18] 18 (10/01 0536) BP: (121-161)/(74-92) 121/76 (10/01 0536) SpO2:  [90 %-100 %] 100 % (10/01 0536) Weight:  [90.9 kg] 90.9 kg (09/30 0937) Last BM Date: 06/12/21  Intake/Output from previous day: 09/30 0701 - 10/01 0700 In: 3127 [P.O.:60; I.V.:2917; IV Piggyback:150] Out: 70 [Urine:50; Blood:20] Intake/Output this shift: No intake/output data recorded.  General appearance: no distress Resp: clear to auscultation bilaterally Cardio: regular rate and rhythm GI: soft approp tender incisions with some ecchymosis  Lab Results:  Recent Labs    06/12/21 0445  WBC 12.5*  HGB 14.4  HCT 42.1  PLT 247   BMET Recent Labs    06/12/21 0445  NA 141  K 4.1  CL 108  CO2 25  GLUCOSE 135*  BUN 28*  CREATININE 1.18  CALCIUM 9.3   PT/INR No results for input(s): LABPROT, INR in the last 72 hours. ABG No results for input(s): PHART, HCO3 in the last 72 hours.  Invalid input(s): PCO2, PO2  Studies/Results: DG Cholangiogram Operative  Result Date: 06/12/2021 CLINICAL DATA:  Intraoperative cholangiogram Right abdominal pain Cholelithiasis EXAM: INTRAOPERATIVE CHOLANGIOGRAM COMPARISON:  Ultrasound abdomen 06/12/2021 FLUOROSCOPY TIME:  Fluoroscopy Time:  19 seconds Radiation Exposure Index (if provided by the fluoroscopic device): 6.1 mGy Number of Acquired Spot Images: 1 FINDINGS: One intraoperative fluoroscopic spot image and 1 cine clip was provided for interpretation. Submitted images and cine clip demonstrates opacification of the intra and extrahepatic bile duct through cystic duct remnant. No filling defects identified within the common bile duct. There is free flow of contrast into the duodenum. IMPRESSION: Intraoperative cholangiogram  demonstrates no evidence of choledocholithiasis. Electronically Signed   By: Acquanetta Belling M.D.   On: 06/12/2021 15:49   US Abdomen Limited RUQ (LIVER/GB)  Result Date: 06/12/2021 CLINICAL DATA:  Right-sided abdominal pain EXAM: ULTRASOUND ABDOMEN LIMITED RIGHT UPPER QUADRANT COMPARISON:  None. FINDINGS: Gallbladder: Sludge. 1.6 cm gallstone. No wall thickening visualized. No sonographic Murphy sign noted by sonographer. Common bile duct: Suboptimally visualized.  Diameter: 3 mm Liver: No focal lesion identified. Within normal limits in parenchymal echogenicity. Portal vein is patent on color Doppler imaging with normal direction of blood flow towards the liver. Other: None. IMPRESSION: Cholelithiasis and sludge. No sonographic evidence of acute cholecystitis. Electronically Signed   By: Guadlupe Spanish M.D.   On: 06/12/2021 05:07    Anti-infectives: Anti-infectives (From admission, onward)    Start     Dose/Rate Route Frequency Ordered Stop   06/12/21 2200  cefTRIAXone (ROCEPHIN) 2 g in sodium chloride 0.9 % 100 mL IVPB        2 g 200 mL/hr over 30 Minutes Intravenous Every 24 hours 06/12/21 1618     06/12/21 1400  cefTRIAXone (ROCEPHIN) 2 g in sodium chloride 0.9 % 100 mL IVPB        2 g 200 mL/hr over 30 Minutes Intravenous On call to O.R. 06/12/21 1610 06/13/21 0559   06/12/21 1200  ceFAZolin (ANCEF) IVPB 2g/100 mL premix        2 g 200 mL/hr over 30 Minutes Intravenous On call to O.R. 06/12/21 1100 06/12/21 1446       Assessment/Plan: POD 1 lap chole-Gerkin -regular diet -home meds -home today if migraine better and tol  po Emelia Loron 06/13/2021

## 2021-06-15 LAB — SURGICAL PATHOLOGY

## 2021-06-15 NOTE — Discharge Summary (Signed)
[facility] Discharge Summary   Patient ID: Daniel Petersen MRN: [id number] DOB/AGE: 60-Aug-1962 60 y.o.  Admit date: 06/12/2021 Discharge date: 06/13/2021  Admitting Diagnosis: Biliary colic [K80.50] Right sided abdominal pain [R10.9] Calculus of gallbladder without cholecystitis without obstruction [K80.20]   Discharge Diagnosis Patient Active Problem List   Diagnosis Date Noted   History of laparoscopic cholecystectomy 06/12/2021    Consultants None  Imaging: No results found.  Procedures Dr. Gerrit Petersen (05/14/21) - Laparoscopic Cholecystectomy with [facility]  Hospital Course:  59 year old male with medical history significant for migraines, snoring, anxiety, BPH who presented to the [facility] emergency department secondary to acute onset right upper quadrant abdominal pain, nausea, emesis.  He stated pain was severe and minimally improved by pain medications given in the emergency department.  He had several episodes of emesis at home but none since arrival to the ED.  Work-up in the ED significant for minimally elevated WBC at 12.5, LFTs WNL, afebrile, vital signs stable.  Ultrasound showed cholelithiasis without findings of acute cholecystitis. Patient was admitted and underwent procedure listed above.  Tolerated procedure well and was transferred to the floor.  Diet was advanced as tolerated.  On POD1, the patient was voiding well, tolerating diet, ambulating well, pain well controlled, vital signs stable, incisions c/d/i and felt stable for discharge home.  Patient will follow up in our office in 3 weeks and knows to call with questions or concerns.  He will call to confirm appointment date/time.    I or a member of my team have reviewed this patient in the Controlled Substance Database. I was not directly involved in this patient's care therefore the information in this discharge summary was taken from the chart.   Allergies as of 06/13/2021   No Known Allergies       Medication List     TAKE these medications    Erenumab-aooe 70 MG/ML Soaj Inject into the skin.   finasteride 5 MG tablet Commonly known as: PROSCAR Take 5 mg by mouth daily.   omeprazole 40 MG capsule Commonly known as: PRILOSEC Take 1 capsule (40 mg total) by mouth daily.   oxyCODONE 5 MG immediate release tablet Commonly known as: Oxy IR/ROXICODONE Take 1 tablet (5 mg total) by mouth every 4 (four) hours as needed for moderate pain (5 mg for 4-6/10 pain. 10 mg >6/10 pain).   sertraline 100 MG tablet Commonly known as: ZOLOFT Take 1.5 tablets (150 mg total) by mouth daily.   tamsulosin 0.4 MG Caps capsule Commonly known as: FLOMAX Take 0.4 mg by mouth daily.   zolmitriptan 5 MG tablet Commonly known as: ZOMIG Take 5 mg by mouth as needed.          Follow-up Information     [facility] Follow up on 07/02/2021.   Specialty: General Surgery Why: follow up after your surgery on 07/02/21 at 3:30pm. Please arrive 30 minutes early to complete check in process and bring photo ID and insurance card Contact information: [address] [place] [address] [phone]                 Signed: Eric Petersen , [facility] 06/15/2021, 2:03 PM Please see Amion for pager number during day hours 7:00am-4:30pm

## 2021-06-15 NOTE — Anesthesia Postprocedure Evaluation (Signed)
Anesthesia Post Note  Patient: TAARIQ LEITZ  Procedure(s) Performed: LAPAROSCOPIC CHOLECYSTECTOMY WITH INTRAOPERATIVE CHOLANGIOGRAM (Abdomen)     Patient location during evaluation: PACU Anesthesia Type: General Level of consciousness: awake and alert Pain management: pain level controlled Vital Signs Assessment: post-procedure vital signs reviewed and stable Respiratory status: spontaneous breathing, nonlabored ventilation, respiratory function stable and patient connected to nasal cannula oxygen Cardiovascular status: blood pressure returned to baseline and stable Postop Assessment: no apparent nausea or vomiting Anesthetic complications: no   No notable events documented.  Last Vitals:  Vitals:   06/13/21 0927 06/13/21 1424  BP: 126/76 110/68  Pulse: 85 92  Resp: 17 17  Temp: 36.8 C 37.1 C  SpO2: 96% 93%    Last Pain:  Vitals:   06/13/21 1424  TempSrc: Oral  PainSc:                  Kameka Whan L Georgana Romain

## 2021-06-18 ENCOUNTER — Telehealth: Payer: Self-pay

## 2021-06-18 NOTE — Telephone Encounter (Signed)
Transition Care Management Follow-up Telephone Call Date of discharge and from where: 06/13/2021   [facility] How have you been since you were released from the hospital? Sore but doing well Any questions or concerns? No  Items Reviewed: Did the pt receive and understand the discharge instructions provided? Yes  Medications obtained and verified? Yes  Other? No  Any new allergies since your discharge? No  Dietary orders reviewed? Yes Do you have support at home? Yes   Home Care and Equipment/Supplies: Were home health services ordered? no If so, what is the name of the agency?  Has the agency set up a time to come to the patient's home? not applicable Were any new equipment or medical supplies ordered?  No What is the name of the medical supply agency?  Were you able to get the supplies/equipment? not applicable Do you have any questions related to the use of the equipment or supplies? No  Functional Questionnaire: (I = Independent and D = Dependent) ADLs: I  Bathing/Dressing- I  Meal Prep- I  Eating- I  Maintaining continence- I  Transferring/Ambulation- I  Managing Meds- I  Follow up appointments reviewed:  PCP Hospital f/u appt confirmed? Yes  Scheduled to see Drue Novel on 06/26/2021  Specialist Hospital f/u appt confirmed?  Scheduled to see surgeon on 10/202/2022 Are transportation arrangements needed?  If their condition worsens, is the pt aware to call PCP or go to the Emergency Dept.? yes Was the patient provided with contact information for the PCP's office or ED? Yes Was to pt encouraged to call back with questions or concerns? Yes  Rowe Pavy, RN, BSN, CEN [facility] [employer] [phone]

## 2021-06-24 ENCOUNTER — Ambulatory Visit: Payer: BLUE CROSS/BLUE SHIELD | Admitting: Internal Medicine

## 2021-06-26 ENCOUNTER — Ambulatory Visit (INDEPENDENT_AMBULATORY_CARE_PROVIDER_SITE_OTHER): Payer: BLUE CROSS/BLUE SHIELD | Admitting: Internal Medicine

## 2021-06-26 ENCOUNTER — Other Ambulatory Visit: Payer: Self-pay

## 2021-06-26 VITALS — BP 128/82 | HR 82 | Temp 98.3°F | Resp 16 | Ht 71.0 in | Wt 189.4 lb

## 2021-06-26 DIAGNOSIS — F419 Anxiety disorder, unspecified: Secondary | ICD-10-CM | POA: Diagnosis not present

## 2021-06-26 DIAGNOSIS — K219 Gastro-esophageal reflux disease without esophagitis: Secondary | ICD-10-CM

## 2021-06-26 DIAGNOSIS — R413 Other amnesia: Secondary | ICD-10-CM | POA: Diagnosis not present

## 2021-06-26 MED ORDER — SERTRALINE HCL 100 MG PO TABS: 150.0000 mg | ORAL_TABLET | Freq: Every day | ORAL | 1 refills | Status: DC

## 2021-06-26 MED ORDER — OMEPRAZOLE 40 MG PO CPDR: 40.0000 mg | DELAYED_RELEASE_CAPSULE | Freq: Every day | ORAL | 1 refills | Status: DC

## 2021-06-26 NOTE — Patient Instructions (Addendum)
Recommend to proceed with the following vaccine at your pharmacy:  Covid #3  We are refilling your medications.   GO TO THE FRONT DESK, PLEASE SCHEDULE YOUR APPOINTMENTS Come back for a physical exam within 3 months

## 2021-06-26 NOTE — Progress Notes (Signed)
Subjective:    Patient ID: Daniel Petersen, male    DOB: 1961/02/04, 60 y.o.   MRN: [id number]  DOS:  06/26/2021 Type of visit - description: Follow-up Since the last office visit, he is doing well. Did have gallbladder surgery , feels fully recuperated.  Previously he complained about difficulties with memory and lack of energy: That seems to be better  Review of Systems See above   Past Medical History:  Diagnosis Date   Anxiety    BPH (benign prostatic hyperplasia)    Enlarged prostate on rectal examination 2017   GERD (gastroesophageal reflux disease)    Migraine    CURRENTLY ENROLLED IN A STUDY BY THE [facility]   Snoring    s/p 3 sleep study: all neg , last 2011   Stuttering    Urinary urgency 2017    Past Surgical History:  Procedure Laterality Date   CHOLECYSTECTOMY N/A 06/12/2021   Procedure: LAPAROSCOPIC CHOLECYSTECTOMY WITH INTRAOPERATIVE CHOLANGIOGRAM;  Surgeon: Darnell Level, MD;  Location: [facility];  Service: General;  Laterality: N/A;   MASS EXCISION N/A 04/29/2017   Procedure: 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 If you have any questions, ask your nurse or doctor.          STOP taking these medications    oxyCODONE 5 MG immediate release tablet Commonly known as: Oxy IR/ROXICODONE Stopped by: Willow Ora, MD       TAKE these medications    Erenumab-aooe 70 MG/ML Soaj Inject into the skin.   finasteride 5 MG tablet Commonly known as: PROSCAR Take 5 mg by mouth daily.   omeprazole 40 MG capsule Commonly known as: PRILOSEC Take 1 capsule (40 mg total) by mouth daily.   sertraline 100 MG tablet Commonly known as: ZOLOFT Take 1.5 tablets (150 mg total) by mouth daily.   tamsulosin 0.4  MG Caps capsule Commonly known as: FLOMAX Take 0.4 mg by mouth daily.   zolmitriptan 5 MG tablet Commonly known as: ZOMIG Take 5 mg by mouth as needed.           Objective:   Physical Exam BP 128/82 (BP Location: Left Arm, Patient Position: Sitting, Cuff Size: Small)   Pulse 82   Temp 98.3 F (36.8 C) (Oral)   Resp 16   Ht 5\' 11"  (1.803 m)   Wt 189 lb 6 oz (85.9 kg)   SpO2 98%   BMI 26.41 kg/m  General:   Well developed, NAD, BMI noted. HEENT:  Normocephalic . Face symmetric, atraumatic Lungs:  CTA B Normal respiratory effort, no intercostal retractions, no accessory muscle use. Heart: RRR,  no murmur.  Lower extremities: no pretibial edema bilaterally  Skin: Not pale. Not jaundice Neurologic:  alert & oriented X3. Stuttering noted.  Gait appropriate for age and unassisted Psych--  Cognition and judgment appear intact.  Cooperative with normal attention span and concentration.  Behavior appropriate. No anxious or depressed appearing.      Assessment    Assessment  Anxiety GERD Migraines, f/u neuro stuttering  Snoring --sleep study 3, all  Negative,  last 2011 BPH: LUTS sx after cscope 05-2015 (06-2015: nl DRE, UA, UDX neg,  PSA wnl; flomax intolerant); urology eval 2017, had a CT-- "massive gland" Back pain-- seen elsewhere 2016 , had a MRI  PLAN: Anxiety: RF sertraline, patient reports symptoms are well controlled GERD: Refill PPIs, symptoms well controlled. BPH: Per urology. Decreased memory, poor concentration, lack of energy: See last OV, did not pursued neuropsychological eval, states is doing better.  Preventive care:  Declined flu shot Recommend COVID booster, benefits Recommend to RTC within 3 months for a physical exam.      This visit occurred during the SARS-CoV-2 public health emergency.  Safety protocols were in place, including screening questions prior to the visit, additional usage of staff PPE, and extensive cleaning of exam room  while observing appropriate contact time as indicated for disinfecting solutions.

## 2021-06-27 DIAGNOSIS — K219 Gastro-esophageal reflux disease without esophagitis: Secondary | ICD-10-CM | POA: Insufficient documentation

## 2021-06-27 NOTE — Assessment & Plan Note (Signed)
Anxiety: RF sertraline, patient reports symptoms are well controlled GERD: Refill PPIs, symptoms well controlled. BPH: Per urology. Decreased memory, poor concentration, lack of energy: See last OV, did not pursued neuropsychological eval, states is doing better.  Preventive care:  Declined flu shot Recommend COVID booster, benefits Recommend to RTC within 3 months for a physical exam.

## 2021-09-03 ENCOUNTER — Ambulatory Visit: Payer: BLUE CROSS/BLUE SHIELD | Attending: Internal Medicine

## 2021-09-03 ENCOUNTER — Ambulatory Visit (INDEPENDENT_AMBULATORY_CARE_PROVIDER_SITE_OTHER): Payer: BLUE CROSS/BLUE SHIELD | Admitting: Family

## 2021-09-03 ENCOUNTER — Other Ambulatory Visit: Payer: Self-pay | Admitting: Family

## 2021-09-03 ENCOUNTER — Encounter: Payer: Self-pay | Admitting: Family

## 2021-09-03 VITALS — BP 112/70 | HR 101 | Temp 99.4°F | Ht 71.0 in | Wt 181.4 lb

## 2021-09-03 DIAGNOSIS — R059 Cough, unspecified: Secondary | ICD-10-CM

## 2021-09-03 DIAGNOSIS — R0981 Nasal congestion: Secondary | ICD-10-CM

## 2021-09-03 DIAGNOSIS — J029 Acute pharyngitis, unspecified: Secondary | ICD-10-CM

## 2021-09-03 DIAGNOSIS — Z23 Encounter for immunization: Secondary | ICD-10-CM

## 2021-09-03 LAB — POC COVID19 BINAXNOW: SARS Coronavirus 2 Ag: NEGATIVE

## 2021-09-03 LAB — POCT INFLUENZA A/B
Influenza A, POC: NEGATIVE
Influenza B, POC: NEGATIVE

## 2021-09-03 MED ORDER — BENZONATATE 100 MG PO CAPS: 100.0000 mg | ORAL_CAPSULE | Freq: Three times a day (TID) | ORAL | 0 refills | Status: DC | PRN

## 2021-09-03 MED ORDER — AMOXICILLIN-POT CLAVULANATE 875-125 MG PO TABS: 1.0000 | ORAL_TABLET | Freq: Two times a day (BID) | ORAL | 0 refills | Status: AC

## 2021-09-03 NOTE — Progress Notes (Signed)
° °  Covid-19 Vaccination Clinic  Name:  Daniel Petersen    MRN: [id number] DOB: [birth year]  09/03/2021  [name] was observed post Covid-19 immunization for 15 minutes without incident. He was provided with Vaccine Information Sheet and instruction to access the V-Safe system.   [name] was instructed to call 911 with any severe reactions post vaccine: Difficulty breathing  Swelling of face and throat  A fast heartbeat  A bad rash all over body  Dizziness and weakness   Immunizations Administered     Name Date Dose VIS Date Route   Pfizer Covid-19 Vaccine Bivalent Booster 09/03/2021 12:07 PM 0.3 mL 05/13/2021 Intramuscular   Manufacturer: [employer], [employer]   Lot: [id number]   NDC: [phone]

## 2021-09-03 NOTE — Progress Notes (Signed)
Daniel Petersen is a 60 y.o. male with the following history as recorded in EpicCare:  Patient Active Problem List   Diagnosis Date Noted   Gastroesophageal reflux disease without esophagitis 06/27/2021   History of laparoscopic cholecystectomy 06/12/2021   Soft tissue mass 04/26/2017   PCP NOTES >>> 06/28/2015   Annual physical exam 07/17/2012   BPH (benign prostatic hyperplasia) 07/14/2011   Migraine    Snoring    Anxiety     Current Outpatient Medications  Medication Sig Dispense Refill   Erenumab-aooe 70 MG/ML SOAJ Inject into the skin.     finasteride (PROSCAR) 5 MG tablet Take 5 mg by mouth daily.     omeprazole (PRILOSEC) 40 MG capsule Take 1 capsule (40 mg total) by mouth daily. 90 capsule 1   sertraline (ZOLOFT) 100 MG tablet Take 1.5 tablets (150 mg total) by mouth daily. 135 tablet 1   tamsulosin (FLOMAX) 0.4 MG CAPS capsule Take 0.4 mg by mouth daily.     zolmitriptan (ZOMIG) 5 MG tablet Take 5 mg by mouth as needed.     No current facility-administered medications for this visit.    Allergies: Patient has no known allergies.  Past Medical History:  Diagnosis Date   Anxiety    BPH (benign prostatic hyperplasia)    Enlarged prostate on rectal examination 2017   GERD (gastroesophageal reflux disease)    Migraine    CURRENTLY ENROLLED IN A STUDY BY THE [facility]   Snoring    s/p 3 sleep study: all neg , last 2011   Stuttering    Urinary urgency 2017    Past Surgical History:  Procedure Laterality Date   CHOLECYSTECTOMY N/A 06/12/2021   Procedure: LAPAROSCOPIC CHOLECYSTECTOMY WITH INTRAOPERATIVE CHOLANGIOGRAM;  Surgeon: Darnell Level, MD;  Location: [facility];  Service: General;  Laterality: N/A;   MASS EXCISION N/A 04/29/2017   Procedure: 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;  Surgeon: Darnell Level, MD;  Location: [facility];  Service: General;  Laterality: N/A;   NASAL SEPTUM SURGERY  2010    Family History  Problem Relation Age of Onset   Stroke  Mother 58           Dementia Mother    Diabetes Maternal Grandmother    Colon cancer Neg Hx    Prostate cancer Neg Hx    CAD Neg Hx     Social History   Tobacco Use   Smoking status: Never   Smokeless tobacco: Never  Substance Use Topics   Alcohol use: No    Alcohol/week: 0.0 standard drinks    Subjective:   4-5 day history of cough/ congestion; works in [employer] and feels he was exposed through his job.  Using OTC Dayquil and Coricidin;  Negative flu and COVID in office today;     Objective:  Vitals:   09/03/21 1113  BP: 112/70  Pulse: (!) 101  Temp: 99.4 F (37.4 C)  TempSrc: Oral  SpO2: 94%  Weight: 181 lb 6.4 oz (82.3 kg)  Height: 5\' 11"  (1.803 m)    General: Well developed, well nourished, in no acute distress  Skin : Warm and dry.  Head: Normocephalic and atraumatic  Eyes: Sclera and conjunctiva clear; pupils round and reactive to light; extraocular movements intact  Ears: External normal; canals clear; tympanic membranes normal  Oropharynx: Pink, supple. No suspicious lesions  Neck: Supple without thyromegaly, adenopathy  Lungs: Respirations unlabored; clear to auscultation bilaterally without wheeze, rales, rhonchi  [employer]  exam: normal rate and regular rhythm.  Neurologic: Alert and oriented; speech intact; face symmetrical; moves all extremities well; CNII-XII intact without focal deficit   Assessment:  1. Sore throat   2. Cough, unspecified type   3. Nasal congestion     Plan:  Rx for Augmentin and Tessalon Perles; take as directed; increase fluids, rest and follow up worse, no better. Work note given as requested;  This visit occurred during the SARS-CoV-2 public health emergency.  Safety protocols were in place, including screening questions prior to the visit, additional usage of staff PPE, and extensive cleaning of exam room while observing appropriate contact time as indicated for disinfecting solutions.    No follow-ups on file.  Orders  Placed This Encounter  Procedures   POCT Influenza A/B   POC COVID-19    Order Specific Question:   Previously tested for COVID-19    Answer:   Yes    Order Specific Question:   Resident in a congregate (group) care setting    Answer:   No    Order Specific Question:   Employed in healthcare setting    Answer:   No    Requested Prescriptions    No prescriptions requested or ordered in this encounter

## 2021-09-04 ENCOUNTER — Other Ambulatory Visit (HOSPITAL_BASED_OUTPATIENT_CLINIC_OR_DEPARTMENT_OTHER): Payer: Self-pay

## 2021-09-04 MED ORDER — PFIZER COVID-19 VAC BIVALENT 30 MCG/0.3ML IM SUSP
INTRAMUSCULAR | 0 refills | Status: DC
  Filled 2021-09-04: qty 0.3, 1d supply, fill #0

## 2021-09-30 ENCOUNTER — Encounter: Payer: BLUE CROSS/BLUE SHIELD | Admitting: Internal Medicine

## 2021-10-14 ENCOUNTER — Other Ambulatory Visit: Payer: Self-pay | Admitting: Internal Medicine

## 2021-10-15 NOTE — Telephone Encounter (Signed)
PA initiated via Covermymeds; KEY: [id number]. PA approved.

## 2021-10-16 ENCOUNTER — Telehealth: Payer: Self-pay | Admitting: Internal Medicine

## 2021-10-16 NOTE — Telephone Encounter (Signed)
PA cancelled by plan.  ° °Your PA has been resolved, no additional PA is required. For further inquiries please contact the number on the back of the member prescription card. (Message [phone]) °

## 2021-10-16 NOTE — Telephone Encounter (Signed)
PA initiated via Covermymeds; KEY: [id number]. Awaiting determination.

## 2021-11-16 NOTE — Telephone Encounter (Signed)
Pt's insurance only covers 90 tablets/capsules in 365 days.  ?

## 2021-12-30 ENCOUNTER — Encounter: Payer: Self-pay | Admitting: Internal Medicine

## 2021-12-30 ENCOUNTER — Other Ambulatory Visit: Payer: Self-pay | Admitting: Internal Medicine

## 2021-12-30 ENCOUNTER — Encounter: Payer: BLUE CROSS/BLUE SHIELD | Admitting: Internal Medicine

## 2022-01-11 ENCOUNTER — Encounter: Payer: Self-pay | Admitting: Internal Medicine

## 2022-01-11 ENCOUNTER — Ambulatory Visit (INDEPENDENT_AMBULATORY_CARE_PROVIDER_SITE_OTHER): Payer: No Typology Code available for payment source | Admitting: Internal Medicine

## 2022-01-11 VITALS — BP 116/72 | HR 48 | Temp 98.0°F | Resp 16 | Ht 71.0 in | Wt 185.5 lb

## 2022-01-11 DIAGNOSIS — Z0000 Encounter for general adult medical examination without abnormal findings: Secondary | ICD-10-CM | POA: Diagnosis not present

## 2022-01-11 DIAGNOSIS — N289 Disorder of kidney and ureter, unspecified: Secondary | ICD-10-CM

## 2022-01-11 NOTE — Patient Instructions (Signed)
   GO TO THE LAB : Get the blood work     GO TO THE FRONT DESK, PLEASE SCHEDULE YOUR APPOINTMENTS Come back for   a physical exam in 1 year   "Living will", "Health Care Power of attorney": Advanced care planning  (If you already have a living will or healthcare power of attorney, please bring the copy to be scanned in your chart.)  Advance care planning is a process that supports adults in  understanding and sharing their preferences regarding future medical care.   The patient's preferences are recorded in documents called Advance Directives.    Advanced directives are completed (and can be modified at any time) while the patient is in full mental capacity.   The documentation should be available at all times to the patient, the family and the healthcare providers.  Bring in a copy to be scanned in your chart is an excellent idea and is recommended   This legal documents direct treatment decision making and/or appoint a surrogate to make the decision if the patient is not capable to do so.    Advance directives can be documented in many types of formats,  documents have names such as:  Lliving will  Durable power of attorney for healthcare (healthcare proxy or healthcare power of attorney)  Combined directives  Physician orders for life-sustaining treatment    More information at:  [email or web address]  

## 2022-01-11 NOTE — Progress Notes (Signed)
? ?Subjective:  ? ? Patient ID: Daniel Petersen, male    DOB: 1961/07/05, 61 y.o.   MRN: [id number] ? ?DOS:  01/11/2022 ?Type of visit - description: cpx ? ?Here for CPX ?General feels well. ?Few months ago had a cholecystectomy, since then is having a BM immediately after each meal. ?Denies nausea vomiting. ?No fever chills ?No blood in the stools. ? ? ? ?Wt Readings from Last 3 Encounters:  ?01/11/22 185 lb 8 oz (84.1 kg)  ?09/03/21 181 lb 6.4 oz (82.3 kg)  ?06/26/21 189 lb 6 oz (85.9 kg)  ? ? ?Review of Systems ? ?Other than above, a 14 point review of systems is negative  ? ? ? ?Past Medical History:  ?Diagnosis Date  ? Anxiety   ? BPH (benign prostatic hyperplasia)   ? Enlarged prostate on rectal examination 2017  ? GERD (gastroesophageal reflux disease)   ? Migraine   ? CURRENTLY ENROLLED IN A STUDY BY [facility]  ? Snoring   ? s/p 3 sleep study: all neg , last 2011  ? Stuttering   ? Urinary urgency 2017  ? ? ?Past Surgical History:  ?Procedure Laterality Date  ? CHOLECYSTECTOMY N/A 06/12/2021  ? Procedure: LAPAROSCOPIC CHOLECYSTECTOMY WITH INTRAOPERATIVE CHOLANGIOGRAM;  Surgeon: Darnell Level, MD;  Location: [facility];  Service: General;  Laterality: N/A;  ? MASS EXCISION N/A 04/29/2017  ? 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 NASAL SEPTUM SURGERY  2010  ? ?Social History  ? ?Socioeconomic History  ? Marital status: Widowed  ?  Spouse name: [name]  ? Number of children: 2  ? Years of education: Associate  ? Highest education level: Not on file  ?Occupational History  ? Occupation: works , Pensions consultant  ?Tobacco Use  ? Smoking status: Never  ? Smokeless tobacco: Never  ?Substance and Sexual Activity  ? Alcohol use: No  ?  Alcohol/week: 0.0 standard drinks  ? Drug use: No  ? Sexual activity: Yes  ?  Partners: Female  ?Other Topics Concern  ? Not on file  ?Social History Narrative  ? Lives with wife who is  in a wheelchair most of the time, has to drive her; she still works   ? Lost wife 11-2021  ? 2 adult children  ? ?Social Determinants of Health  ? ?Financial Resource Strain: Not on file  ?Food Insecurity: Not on file  ?Transportation Needs: Not on file  ?Physical Activity: Not on file  ?Stress: Not on file  ?Social Connections: Not on file  ?Intimate Partner Violence: Not on file  ? ? ?Current Outpatient Medications  ?Medication Instructions  ? Erenumab-aooe 70 MG/ML SOAJ Subcutaneous  ? finasteride (PROSCAR) 5 mg, Oral, Daily  ? omeprazole (PRILOSEC) 40 mg, Oral, Daily  ? sertraline (ZOLOFT) 100 MG tablet TAKE 1.5 TABLETS (150MG  TOTAL) BY MOUTH DAILY  ? tamsulosin (FLOMAX) 0.4 mg, Oral, Daily  ? zolmitriptan (ZOMIG) 5 mg, Oral, As needed,    ? ? ?   ?Objective:  ? Physical Exam ?BP 116/72 (BP Location: Left Arm, Patient Position: Sitting, Cuff Size: Small)   Pulse (!) 48   Temp 98 ?F (36.7 ?C) (Oral)   Resp 16   Ht 5\' 11"  (1.803 m)   Wt 185 lb 8 oz (84.1 kg)   SpO2 91%   BMI 25.87 kg/m?  ?General: ?Well developed, NAD, BMI noted ?Neck: No  thyromegaly  ?  HEENT:  ?Normocephalic . Face symmetric, atraumatic ?Lungs:  ?CTA B ?Normal respiratory effort, no intercostal retractions, no accessory muscle use. ?Heart: RRR,  no murmur.  ?Abdomen:  ?Not distended, soft, non-tender. No rebound or rigidity.   ?Lower extremities: no pretibial edema bilaterally  ?Skin: Exposed areas without rash. Not pale. Not jaundice ?Neurologic:  ?alert & oriented X3.  ?Speech normal, gait appropriate for age and unassisted ?Strength symmetric and appropriate for age.  ?Psych: ?Cognition and judgment appear intact.  ?Cooperative with normal attention span and concentration.  ?Behavior appropriate. ?No anxious or depressed appearing. ? ?   ?Assessment   ?Assessment  ?Anxiety ?GERD ?Migraines, f/u neuro ?stuttering  ?Snoring --sleep study ?3, all  Negative,  last 2011 ?BPH: LUTS sx after cscope 05-2015 (06-2015: nl DRE, UA, UDX neg, PSA wnl;  flomax intolerant); urology eval 2017, had a CT-- "massive gland" ?Back pain-- seen elsewhere 2016 , had a MRI ? ?PLAN: ?Here for CPX ?Anxiety: On sertraline. Controlled  ?Recently lost his wife, she was chronically ill, managing the situation well. ?Migraines: Per neurology ?BPH: Denies symptoms, per urology. ?Postcholecystectomy: Having BMs after each meal since he had a cholecystectomy, no red flags, advised the symptom will likely get better with time. Call if no improvement, checking labs ?RTC 1 year ? ? ?

## 2022-01-11 NOTE — Assessment & Plan Note (Signed)
Here for CPX ?Anxiety: On sertraline. Controlled  ?Recently lost his wife, she was chronically ill, managing the situation well. ?Migraines: Per neurology ?BPH: Denies symptoms, per urology. ?Postcholecystectomy: Having BMs after each meal since he had a cholecystectomy, no red flags, advised the symptom will likely get better with time. Call if no improvement, checking labs ?RTC 1 year ?

## 2022-01-11 NOTE — Assessment & Plan Note (Signed)
-  Td 02-2018  ?- covid vax x 5, last 09-03-2021 ?- CCS: Cscope 05-2015, wnl, 10 years ?--prostate ca screening: sees urology , due for a visit, plans to call ?--Diet-exercise discussed.  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 ?--Labs: CMP, CBC, FLP , TSH ?- ACP info provided ?  ?  ?

## 2022-01-12 LAB — COMPREHENSIVE METABOLIC PANEL
ALT: 16 U/L (ref 0–53)
AST: 18 U/L (ref 0–37)
Albumin: 4 g/dL (ref 3.5–5.2)
Alkaline Phosphatase: 88 U/L (ref 39–117)
BUN: 20 mg/dL (ref 6–23)
CO2: 27 mEq/L (ref 19–32)
Calcium: 8.8 mg/dL (ref 8.4–10.5)
Chloride: 105 mEq/L (ref 96–112)
Creatinine, Ser: 1.53 mg/dL — ABNORMAL HIGH (ref 0.40–1.50)
GFR: 49.16 mL/min — ABNORMAL LOW (ref >60.00)
Glucose, Bld: 75 mg/dL (ref 70–99)
Potassium: 3.8 mEq/L (ref 3.5–5.1)
Sodium: 141 mEq/L (ref 135–145)
Total Bilirubin: 0.4 mg/dL (ref 0.2–1.2)
Total Protein: 7.2 g/dL (ref 6.0–8.3)

## 2022-01-12 LAB — CBC WITH DIFFERENTIAL/PLATELET
Basophils Absolute: 0.1 10*3/uL (ref 0.0–0.1)
Basophils Relative: 1.2 % (ref 0.0–3.0)
Eosinophils Absolute: 0.2 10*3/uL (ref 0.0–0.7)
Eosinophils Relative: 2 % (ref 0.0–5.0)
HCT: 41.7 % (ref 39.0–52.0)
Hemoglobin: 13.9 g/dL (ref 13.0–17.0)
Lymphocytes Relative: 31 % (ref 12.0–46.0)
Lymphs Abs: 2.6 10*3/uL (ref 0.7–4.0)
MCHC: 33.4 g/dL (ref 30.0–36.0)
MCV: 90.7 fl (ref 78.0–100.0)
Monocytes Absolute: 0.7 10*3/uL (ref 0.1–1.0)
Monocytes Relative: 8 % (ref 3.0–12.0)
Neutro Abs: 4.8 10*3/uL (ref 1.4–7.7)
Neutrophils Relative %: 57.8 % (ref 43.0–77.0)
Platelets: 254 10*3/uL (ref 150.0–400.0)
RBC: 4.6 Mil/uL (ref 4.22–5.81)
RDW: 13.4 % (ref 11.5–15.5)
WBC: 8.3 10*3/uL (ref 4.0–10.5)

## 2022-01-12 LAB — LIPID PANEL
Cholesterol: 167 mg/dL (ref 0–200)
HDL: 52.8 mg/dL (ref >39.00)
LDL Cholesterol: 88 mg/dL (ref 0–99)
NonHDL: 113.9
Total CHOL/HDL Ratio: 3
Triglycerides: 131 mg/dL (ref 0.0–149.0)
VLDL: 26.2 mg/dL (ref 0.0–40.0)

## 2022-01-12 LAB — TSH: TSH: 1 u[IU]/mL (ref 0.35–5.50)

## 2022-01-15 NOTE — Addendum Note (Signed)
Addended byConrad Quantico D on: 01/15/2022 01:31 PM ? ? Modules accepted: Orders ? ?

## 2022-03-08 ENCOUNTER — Encounter: Payer: Self-pay | Admitting: Internal Medicine

## 2022-03-31 ENCOUNTER — Other Ambulatory Visit: Payer: Self-pay | Admitting: Internal Medicine

## 2022-08-01 ENCOUNTER — Encounter: Payer: Self-pay | Admitting: Internal Medicine

## 2022-09-22 ENCOUNTER — Other Ambulatory Visit: Payer: Self-pay | Admitting: Internal Medicine

## 2022-10-06 ENCOUNTER — Ambulatory Visit (INDEPENDENT_AMBULATORY_CARE_PROVIDER_SITE_OTHER): Payer: No Typology Code available for payment source | Admitting: Internal Medicine

## 2022-10-06 ENCOUNTER — Encounter: Payer: Self-pay | Admitting: Internal Medicine

## 2022-10-06 VITALS — BP 118/80 | HR 80 | Temp 98.0°F | Resp 16 | Ht 71.0 in | Wt 189.2 lb

## 2022-10-06 DIAGNOSIS — K9189 Other postprocedural complications and disorders of digestive system: Secondary | ICD-10-CM | POA: Diagnosis not present

## 2022-10-06 DIAGNOSIS — R197 Diarrhea, unspecified: Secondary | ICD-10-CM | POA: Diagnosis not present

## 2022-10-06 DIAGNOSIS — I10 Essential (primary) hypertension: Secondary | ICD-10-CM

## 2022-10-06 MED ORDER — CHOLESTYRAMINE 4 G PO PACK: 4.0000 g | PACK | Freq: Every day | ORAL | 6 refills | Status: DC

## 2022-10-06 MED ORDER — PREDNISONE 10 MG PO TABS: ORAL_TABLET | ORAL | 0 refills | Status: DC

## 2022-10-06 NOTE — Patient Instructions (Addendum)
Hip pain: Prednisone for few days Continue with Tylenol If not gradually better let me know.   Diarrhea: Try Questran 1 packet each day.   Next appointment scheduled for May.  Call sooner if needed.

## 2022-10-06 NOTE — Assessment & Plan Note (Signed)
Radiculopathy: L5 radiculopathy?Marland Kitchen  On physical exam I suspect the pain is more radiculopathy than hip related. Plan: Round of prednisone, continue Tylenol, call if not gradually better Postcholecystectomy diarrhea: The patient continue with postprandial diarrhea, symptoms started after cholecystectomy 05-2021. No red flag symptoms, update on CCS. Recommend trial with Questran.  If not better will refer to GI. RTC already scheduled for May.

## 2022-10-06 NOTE — Progress Notes (Signed)
Subjective:    Patient ID: Daniel Petersen, male    DOB: 1961/01/16, 62 y.o.   MRN: [id number]  DOS:  10/06/2022 Type of visit - description: Acute visit  6 weeks history of pain located at the left hip, laterally, radiates to the leg (external aspect up to the ankle). No injury.  No rash. No bladder or bowel incontinence.  No fever. Pain is worse when he sits down for prolonged  times.  Also, ongoing diarrhea since cholecystectomy 05-2021. No weight loss noted. No N-V or blood in stools   Last BMP showed slightly increased creatinine, not taking any NSAIDs.   Wt Readings from Last 3 Encounters:  10/06/22 189 lb 4 oz (85.8 kg)  01/11/22 185 lb 8 oz (84.1 kg)  09/03/21 181 lb 6.4 oz (82.3 kg)    Review of Systems See above   Past Medical History:  Diagnosis Date   Anxiety    BPH (benign prostatic hyperplasia)    Enlarged prostate on rectal examination 2017   GERD (gastroesophageal reflux disease)    Migraine    CURRENTLY ENROLLED IN A STUDY BY [facility]   Snoring    s/p 3 sleep study: all neg , last 2011   Stuttering    Urinary urgency 2017    Past Surgical History:  Procedure Laterality Date   CHOLECYSTECTOMY N/A 06/12/2021   Procedure: LAPAROSCOPIC CHOLECYSTECTOMY WITH INTRAOPERATIVE CHOLANGIOGRAM;  Surgeon: Armandina Gemma, MD;  Location: [facility];  Service: General;  Laterality: N/A;   MASS EXCISION N/A 04/29/2017   Procedure: 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;  Surgeon: Armandina Gemma, MD;  Location: [place];  Service: General;  Laterality: N/A;   NASAL SEPTUM SURGERY  2010    Current Outpatient Medications  Medication Instructions   cholestyramine (QUESTRAN) 4 g, Oral, Daily   finasteride (PROSCAR) 5 mg, Oral, Daily   omeprazole (PRILOSEC) 40 mg, Oral, Daily   predniSONE (DELTASONE) 10 MG tablet 4 tablets x 2 days, 3 tabs x 2 days, 2 tabs x 2 days, 1 tab x 2 days   sertraline (ZOLOFT) 150 mg, Oral, Daily   tamsulosin (FLOMAX) 0.4 mg,  Oral, Daily   zolmitriptan (ZOMIG) 5 mg, Oral, As needed,         Objective:   Physical Exam BP 118/80   Pulse 80   Temp 98 F (36.7 C) (Oral)   Resp 16   Ht 5\' 11"  (1.803 m)   Wt 189 lb 4 oz (85.8 kg)   SpO2 97%   BMI 26.40 kg/m  General:   Well developed, NAD, BMI noted. HEENT:  Normocephalic . Face symmetric, atraumatic Lower extremities: no pretibial edema bilaterally. Hip rotation: Normal bilaterally. Trochanteric bursa's: No TTP. Skin: Not pale. Not jaundice Neurologic:  alert & oriented X3.  Speech normal, gait appropriate for age and unassisted.  Motor and DTR symmetric.  Straight leg test negative. Psych--  Cognition and judgment appear intact.  Cooperative with normal attention span and concentration.  Behavior appropriate. No anxious or depressed appearing.      Assessment     Assessment  Anxiety GERD Migraines, f/u neuro stuttering  Snoring --sleep study 3, all  Negative,  last 2011 BPH: LUTS sx after cscope 05-2015 (06-2015: nl DRE, UA, UDX neg, PSA wnl; flomax intolerant); urology eval 2017, had a CT-- "massive gland" Back pain-- seen elsewhere 2016 , had a MRI  PLAN: Radiculopathy: L5 radiculopathy?Marland Kitchen  On physical exam I suspect the pain is more radiculopathy  than hip related. Plan: Round of prednisone, continue Tylenol, call if not gradually better Postcholecystectomy diarrhea: The patient continue with postprandial diarrhea, symptoms started after cholecystectomy 05-2021. No red flag symptoms, update on CCS. Recommend trial with Questran.  If not better will refer to GI. RTC already scheduled for May.  1 new problem, 1 persistent problem

## 2022-10-07 LAB — BASIC METABOLIC PANEL
BUN: 26 mg/dL — ABNORMAL HIGH (ref 6–23)
CO2: 26 mEq/L (ref 19–32)
Calcium: 9 mg/dL (ref 8.4–10.5)
Chloride: 104 mEq/L (ref 96–112)
Creatinine, Ser: 1.26 mg/dL (ref 0.40–1.50)
GFR: 61.74 mL/min (ref >60.00)
Glucose, Bld: 85 mg/dL (ref 70–99)
Potassium: 4.3 mEq/L (ref 3.5–5.1)
Sodium: 138 mEq/L (ref 135–145)

## 2022-10-14 ENCOUNTER — Other Ambulatory Visit: Payer: Self-pay | Admitting: Internal Medicine

## 2022-10-28 ENCOUNTER — Other Ambulatory Visit: Payer: Self-pay | Admitting: Internal Medicine

## 2022-11-03 ENCOUNTER — Encounter: Payer: Self-pay | Admitting: Internal Medicine

## 2022-11-03 DIAGNOSIS — M5410 Radiculopathy, site unspecified: Secondary | ICD-10-CM

## 2022-11-04 NOTE — Telephone Encounter (Signed)
Enter sports medicine referral, DX radiculopathy

## 2022-11-05 NOTE — Telephone Encounter (Signed)
Referral placed.

## 2022-11-05 NOTE — Addendum Note (Signed)
Addended byDamita Dunnings D on: 11/05/2022 07:56 AM   Modules accepted: Orders

## 2022-12-07 NOTE — Progress Notes (Signed)
[name] D.[facility] [address] [place] [address] Phone: [phone]   Assessment and Plan:     1. Chronic bilateral low back pain with left-sided sciatica 2. DDD (degenerative disc disease), lumbar 3. Lumbar radiculopathy - Chronic with exacerbation, initial sports medicine visit - Most consistent with sciatica from lumbar origin with degenerative changes seen in lumbar spine, grade 1 anterior listhesis L4 and L5, facet arthropathy - Based on patient's failure to improve for >6 weeks since initial visit for this issue on 10/02/2022, recommend further evaluation with lumbar MRI.  I suspect to find pathology around L4-L5 versus L5-S1 that may explain patient's radicular symptoms - Start meloxicam 15 mg daily x2 weeks.  If still having pain after 2 weeks, complete 3rd-week of meloxicam. May use remaining meloxicam as needed once daily for pain control.  Do not to use additional NSAIDs while taking meloxicam.  May use Tylenol [phone] mg 2 to 3 times a day for breakthrough pain. - Start HEP for sciatica - X-ray obtained in clinic.  My interpretation: No acute fracture, dislocation, or vertebral collapse.  Degenerative changes seen in lower lumbar spine with grade 1 anterior listhesis L4 and L5, mild dextroscoliosis of lumbar spine   Pertinent previous records reviewed include primary care note 10/06/2022   Follow Up: 3 days after MRI to review results and discuss treatment plan.  Patient may ultimately benefit from epidural CSI based on results   Subjective:   I, Moenique Parris, am serving as a Neurosurgeon for Doctor Richardean Sale  Chief Complaint: back pain   HPI:   12/08/2022 Patient is a 62 year old male complaining of back pain. Patient states he has back pain for about 3-4 months, pain is on the left side, does radiate down his leg , no meds for he pain states they dont help, no numbness or tingling, sitting , and driving increase the  pain , he has relief when he stands   Relevant Historical Information: None pertinent  Additional pertinent review of systems negative.   Current Outpatient Medications:    finasteride (PROSCAR) 5 MG tablet, Take 5 mg by mouth daily., Disp: , Rfl:    meloxicam (MOBIC) 15 MG tablet, Take 1 tablet (15 mg total) by mouth daily., Disp: 30 tablet, Rfl: 0   omeprazole (PRILOSEC) 40 MG capsule, Take 1 capsule (40 mg total) by mouth daily., Disp: 90 capsule, Rfl: 1   sertraline (ZOLOFT) 100 MG tablet, Take 1.5 tablets (150 mg total) by mouth daily., Disp: 135 tablet, Rfl: 1   tamsulosin (FLOMAX) 0.4 MG CAPS capsule, Take 0.4 mg by mouth daily., Disp: , Rfl:    zolmitriptan (ZOMIG) 5 MG tablet, Take 5 mg by mouth as needed., Disp: , Rfl:    Objective:     Vitals:   12/08/22 1425  Pulse: 82  SpO2: 96%  Weight: 189 lb (85.7 kg)  Height: 5\' 11"  (1.803 m)      Body mass index is 26.36 kg/m.    Physical Exam:    Gen: Appears well, nad, nontoxic and pleasant Psych: Alert and oriented, appropriate mood and affect Neuro: sensation intact, strength is 5/5 in upper and lower extremities, muscle tone wnl Skin: no susupicious lesions or rashes  Back - Normal skin, Spine with normal alignment and no deformity.   No tenderness to vertebral process palpation.   Paraspinous muscles are not tender and without spasm Mild TTP left gluteal musculature Straight leg raise positive left,  negative right Trendelenberg increased pain when lifting left leg Piriformis Test negative    Electronically signed by:  [name] D.[facility] 2:53 PM 12/08/22

## 2022-12-08 ENCOUNTER — Ambulatory Visit (INDEPENDENT_AMBULATORY_CARE_PROVIDER_SITE_OTHER): Payer: No Typology Code available for payment source

## 2022-12-08 ENCOUNTER — Ambulatory Visit (INDEPENDENT_AMBULATORY_CARE_PROVIDER_SITE_OTHER): Payer: No Typology Code available for payment source | Admitting: Sports Medicine

## 2022-12-08 VITALS — HR 82 | Ht 71.0 in | Wt 189.0 lb

## 2022-12-08 DIAGNOSIS — M5442 Lumbago with sciatica, left side: Secondary | ICD-10-CM

## 2022-12-08 DIAGNOSIS — M545 Low back pain, unspecified: Secondary | ICD-10-CM | POA: Diagnosis not present

## 2022-12-08 DIAGNOSIS — M5136 Other intervertebral disc degeneration, lumbar region: Secondary | ICD-10-CM | POA: Diagnosis not present

## 2022-12-08 DIAGNOSIS — M5416 Radiculopathy, lumbar region: Secondary | ICD-10-CM | POA: Diagnosis not present

## 2022-12-08 DIAGNOSIS — G8929 Other chronic pain: Secondary | ICD-10-CM

## 2022-12-08 MED ORDER — MELOXICAM 15 MG PO TABS: 15.0000 mg | ORAL_TABLET | Freq: Every day | ORAL | 0 refills | Status: DC

## 2022-12-08 NOTE — Patient Instructions (Addendum)
Good to see you  - Start meloxicam 15 mg daily x2 weeks.  If still having pain after 2 weeks, complete 3rd-week of meloxicam. May use remaining meloxicam as needed once daily for pain control.  Do not to use additional NSAIDs while taking meloxicam.  May use Tylenol [phone] mg 2 to 3 times a day for breakthrough pain. Low back HEP MRI referral  Follow up 3 days after MRI to discuss results

## 2023-01-02 ENCOUNTER — Ambulatory Visit
Admission: RE | Admit: 2023-01-02 | Discharge: 2023-01-02 | Disposition: A | Discharge status: Home / Self Care | Payer: No Typology Code available for payment source | Source: Ambulatory Visit | Attending: Sports Medicine | Admitting: Sports Medicine

## 2023-01-02 DIAGNOSIS — G8929 Other chronic pain: Secondary | ICD-10-CM

## 2023-01-04 NOTE — Progress Notes (Unsigned)
    [name] D.[facility] [address] [place] [address] Phone: [phone]   Assessment and Plan:     There are no diagnoses linked to this encounter.  ***   Pertinent previous records reviewed include ***   Follow Up: ***     Subjective:   I, Daniel Petersen, am serving as a Neurosurgeon for Daniel Petersen   Chief Complaint: back pain    HPI:    12/08/2022 Patient is a 62 year old male complaining of back pain. Patient states he has back pain for about 3-4 months, pain is on the left side, does radiate down his leg , no meds for he pain states they dont help, no numbness or tingling, sitting , and driving increase the pain , he has relief when he stands   01/05/2023 Patient states    Relevant Historical Information: None pertinent Additional pertinent review of systems negative.   Current Outpatient Medications:    finasteride (PROSCAR) 5 MG tablet, Take 5 mg by mouth daily., Disp: , Rfl:    meloxicam (MOBIC) 15 MG tablet, Take 1 tablet (15 mg total) by mouth daily., Disp: 30 tablet, Rfl: 0   omeprazole (PRILOSEC) 40 MG capsule, Take 1 capsule (40 mg total) by mouth daily., Disp: 90 capsule, Rfl: 1   sertraline (ZOLOFT) 100 MG tablet, Take 1.5 tablets (150 mg total) by mouth daily., Disp: 135 tablet, Rfl: 1   tamsulosin (FLOMAX) 0.4 MG CAPS capsule, Take 0.4 mg by mouth daily., Disp: , Rfl:    zolmitriptan (ZOMIG) 5 MG tablet, Take 5 mg by mouth as needed., Disp: , Rfl:    Objective:     There were no vitals filed for this visit.    There is no height or weight on file to calculate BMI.    Physical Exam:    ***   Electronically signed by:  [name] [facility] 4:13 PM 01/04/23

## 2023-01-05 ENCOUNTER — Ambulatory Visit: Payer: No Typology Code available for payment source | Admitting: Sports Medicine

## 2023-01-05 VITALS — BP 108/78 | HR 77 | Ht 71.0 in | Wt 188.0 lb

## 2023-01-05 DIAGNOSIS — G8929 Other chronic pain: Secondary | ICD-10-CM

## 2023-01-05 DIAGNOSIS — M5442 Lumbago with sciatica, left side: Secondary | ICD-10-CM | POA: Diagnosis not present

## 2023-01-05 DIAGNOSIS — M5136 Other intervertebral disc degeneration, lumbar region: Secondary | ICD-10-CM | POA: Diagnosis not present

## 2023-01-05 DIAGNOSIS — M5416 Radiculopathy, lumbar region: Secondary | ICD-10-CM | POA: Diagnosis not present

## 2023-01-05 NOTE — Patient Instructions (Addendum)
Good to see you  Discontinue meloxicam use remainder as needed Tylenol [phone] mg 2-3 times a day for pain relief  Epidural left L4-5 Follow up 2 weeks after to discuss results

## 2023-01-17 ENCOUNTER — Encounter: Payer: Self-pay | Admitting: Internal Medicine

## 2023-01-17 ENCOUNTER — Ambulatory Visit (INDEPENDENT_AMBULATORY_CARE_PROVIDER_SITE_OTHER): Payer: No Typology Code available for payment source | Admitting: Internal Medicine

## 2023-01-17 VITALS — BP 134/88 | HR 77 | Temp 98.1°F | Resp 16 | Ht 71.0 in | Wt 188.0 lb

## 2023-01-17 DIAGNOSIS — Z0000 Encounter for general adult medical examination without abnormal findings: Secondary | ICD-10-CM

## 2023-01-17 DIAGNOSIS — I10 Essential (primary) hypertension: Secondary | ICD-10-CM | POA: Diagnosis not present

## 2023-01-17 LAB — LIPID PANEL
Cholesterol: 138 mg/dL (ref 0–200)
HDL: 48.3 mg/dL (ref >39.00)
LDL Cholesterol: 70 mg/dL (ref 0–99)
NonHDL: 89.62
Total CHOL/HDL Ratio: 3
Triglycerides: 96 mg/dL (ref 0.0–149.0)
VLDL: 19.2 mg/dL (ref 0.0–40.0)

## 2023-01-17 LAB — CBC WITH DIFFERENTIAL/PLATELET
Basophils Absolute: 0.1 10*3/uL (ref 0.0–0.1)
Basophils Relative: 0.7 % (ref 0.0–3.0)
Eosinophils Absolute: 0.2 10*3/uL (ref 0.0–0.7)
Eosinophils Relative: 2.2 % (ref 0.0–5.0)
HCT: 40.6 % (ref 39.0–52.0)
Hemoglobin: 14 g/dL (ref 13.0–17.0)
Lymphocytes Relative: 33.1 % (ref 12.0–46.0)
Lymphs Abs: 2.7 10*3/uL (ref 0.7–4.0)
MCHC: 34.5 g/dL (ref 30.0–36.0)
MCV: 90.4 fl (ref 78.0–100.0)
Monocytes Absolute: 0.5 10*3/uL (ref 0.1–1.0)
Monocytes Relative: 6 % (ref 3.0–12.0)
Neutro Abs: 4.8 10*3/uL (ref 1.4–7.7)
Neutrophils Relative %: 58 % (ref 43.0–77.0)
Platelets: 287 10*3/uL (ref 150.0–400.0)
RBC: 4.49 Mil/uL (ref 4.22–5.81)
RDW: 13.1 % (ref 11.5–15.5)
WBC: 8.2 10*3/uL (ref 4.0–10.5)

## 2023-01-17 LAB — BASIC METABOLIC PANEL
BUN: 25 mg/dL — ABNORMAL HIGH (ref 6–23)
CO2: 25 mEq/L (ref 19–32)
Calcium: 8.8 mg/dL (ref 8.4–10.5)
Chloride: 108 mEq/L (ref 96–112)
Creatinine, Ser: 1.22 mg/dL (ref 0.40–1.50)
GFR: 64.05 mL/min (ref >60.00)
Glucose, Bld: 81 mg/dL (ref 70–99)
Potassium: 3.8 mEq/L (ref 3.5–5.1)
Sodium: 141 mEq/L (ref 135–145)

## 2023-01-17 NOTE — Assessment & Plan Note (Signed)
-  Td 02-2018  - Vaccines I recommend: COVID booster, Shingrix, RSV, flu shot every fall; pros>cons d/w pt - CCS: Cscope 05-2015, wnl, 10 years --prostate ca screening: sees urology  -- Lifestyle discussed --Labs: BMP FLP CBC  - ACP info provided

## 2023-01-17 NOTE — Patient Instructions (Addendum)
Vaccines I recommend: Covid booster Shingrix (shingles) RSV Flu shot every fall     GO TO THE LAB : Get the blood work     GO TO THE FRONT DESK, PLEASE SCHEDULE YOUR APPOINTMENTS Come back for a physical exam in 1 year    "Health Care Power of attorney" ,  "Living will" (Advance care planning documents)  If you already have a living will or healthcare power of attorney, is recommended you bring the copy to be scanned in your chart.   The document will be available to all the doctors you see in the system.  Advance care planning is a process that supports adults in  understanding and sharing their preferences regarding future medical care.  The patient's preferences are recorded in documents called Advance Directives and the can be modified at any time while the patient is in full mental capacity.   If you don't have one, please consider create one.      More information at: [email or web address]

## 2023-01-17 NOTE — Progress Notes (Signed)
Subjective:    Patient ID: Daniel Petersen, male    DOB: Jul 21, 1961, 62 y.o.   MRN: [id number]  DOS:  01/17/2023 Type of visit - description: cpx  Here for CPX In general feeling well. Still has occasional diarrhea without blood in the stools. LUTS: Symptoms well-controlled.  Review of Systems  Other than above, a 14 point review of systems is negative    Past Medical History:  Diagnosis Date   Anxiety    BPH (benign prostatic hyperplasia)    Enlarged prostate on rectal examination 2017   GERD (gastroesophageal reflux disease)    Migraine    CURRENTLY ENROLLED IN A STUDY BY [facility]   Snoring    s/p 3 sleep study: all neg , last 2011   Stuttering    Urinary urgency 2017    Past Surgical History:  Procedure Laterality Date   CHOLECYSTECTOMY N/A 06/12/2021   Procedure: LAPAROSCOPIC CHOLECYSTECTOMY WITH INTRAOPERATIVE CHOLANGIOGRAM;  Surgeon: Darnell Level, MD;  Location: [facility];  Service: General;  Laterality: N/A;   MASS EXCISION N/A 04/29/2017   Procedure: 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;  Surgeon: Darnell Level, MD;  Location: [facility];  Service: General;  Laterality: N/A;   NASAL SEPTUM SURGERY  2010   Social History   Socioeconomic History   Marital status: Widowed    Spouse name: Not on file   Number of children: 2   Years of education: Associate   Highest education level: Not on file  Occupational History   Occupation: works , Pensions consultant  Tobacco Use   Smoking status: Never   Smokeless tobacco: Never  Substance and Sexual Activity   Alcohol use: No    Alcohol/week: 0.0 standard drinks of alcohol   Drug use: No   Sexual activity: Yes    Partners: Female  Other Topics Concern   Not on file  Social History Narrative   Lost wife 11-2021   2 adult children   Social Determinants of Health   Financial Resource Strain: Not on file  Food Insecurity: Not on file  Transportation Needs: Not on file  Physical Activity: Not on file   Stress: Not on file  Social Connections: Not on file  Intimate Partner Violence: Not on file     Current Outpatient Medications  Medication Instructions   finasteride (PROSCAR) 5 mg, Oral, Daily   omeprazole (PRILOSEC) 40 mg, Oral, Daily   sertraline (ZOLOFT) 150 mg, Oral, Daily   tamsulosin (FLOMAX) 0.4 mg, Oral, Daily   zolmitriptan (ZOMIG) 5 mg, Oral, As needed,         Objective:   Physical Exam BP 134/88   Pulse 77   Temp 98.1 F (36.7 C) (Oral)   Resp 16   Ht 5\' 11"  (1.803 m)   Wt 188 lb (85.3 kg)   SpO2 98%   BMI 26.22 kg/m  General: Well developed, NAD, BMI noted Neck: No  thyromegaly  HEENT:  Normocephalic . Face symmetric, atraumatic Lungs:  CTA B Normal respiratory effort, no intercostal retractions, no accessory muscle use. Heart: RRR,  no murmur.  Abdomen:  Not distended, soft, non-tender. No rebound or rigidity.   Lower extremities: no pretibial edema bilaterally  Skin: Exposed areas without rash. Not pale. Not jaundice Neurologic:  alert & oriented X3.  Speech normal, gait appropriate for age and unassisted Strength symmetric and appropriate for age.  Psych: Cognition and judgment appear intact.  Cooperative with normal attention span and concentration.  Behavior appropriate.  No anxious or depressed appearing.     Assessment     Assessment  Anxiety GERD Migraines, f/u neuro stuttering  Snoring --sleep study 3, all  Negative,  last 2011 BPH: LUTS sx after cscope 05-2015 (06-2015: nl DRE, UA, UDX neg, PSA wnl; flomax intolerant); urology eval 2017, had a CT-- "massive gland" Back pain-- seen elsewhere 2016 , had a MRI  PLAN: Here for CPX Anxiety: Doing well on sertraline, lost his wife last year, still grieving but seems to be doing okay. Radiculopathy: Now under the care of for sports medicine, to have ESI this week. Postcholecystectomy diarrhea: See LOV, was Rx Questran >> did not help, declines a GI referral,  plans to decrease  greasy foods and see if that helps. BPH: Symptoms well-controlled at present time RTC 1 year

## 2023-01-17 NOTE — Assessment & Plan Note (Signed)
Here for CPX Anxiety: Doing well on sertraline, lost his wife last year, still grieving but seems to be doing okay. Radiculopathy: Now under the care of for sports medicine, to have ESI this week. Postcholecystectomy diarrhea: See LOV, was Rx Questran >> did not help, declines a GI referral,  plans to decrease greasy foods and see if that helps. BPH: Symptoms well-controlled at present time RTC 1 year

## 2023-01-21 ENCOUNTER — Ambulatory Visit
Admission: RE | Admit: 2023-01-21 | Discharge: 2023-01-21 | Disposition: A | Discharge status: Home / Self Care | Payer: No Typology Code available for payment source | Source: Ambulatory Visit | Attending: Sports Medicine | Admitting: Sports Medicine

## 2023-01-21 DIAGNOSIS — M5136 Other intervertebral disc degeneration, lumbar region: Secondary | ICD-10-CM

## 2023-01-21 DIAGNOSIS — M5416 Radiculopathy, lumbar region: Secondary | ICD-10-CM

## 2023-01-21 DIAGNOSIS — G8929 Other chronic pain: Secondary | ICD-10-CM

## 2023-01-21 MED ORDER — IOPAMIDOL (ISOVUE-M 200) INJECTION 41%
1.0000 mL | Freq: Once | INTRAMUSCULAR | Status: AC
  Administered 2023-01-21: 1 mL via EPIDURAL

## 2023-01-21 MED ORDER — METHYLPREDNISOLONE ACETATE 40 MG/ML INJ SUSP (RADIOLOG
80.0000 mg | Freq: Once | INTRAMUSCULAR | Status: AC
  Administered 2023-01-21: 80 mg via EPIDURAL

## 2023-01-21 NOTE — Discharge Instructions (Signed)

## 2023-04-01 ENCOUNTER — Other Ambulatory Visit: Payer: Self-pay | Admitting: Internal Medicine

## 2023-04-14 ENCOUNTER — Other Ambulatory Visit: Payer: Self-pay | Admitting: Internal Medicine

## 2023-04-27 ENCOUNTER — Encounter: Payer: Self-pay | Admitting: Internal Medicine

## 2023-07-26 LAB — PSA: PSA: 1.04

## 2023-08-03 ENCOUNTER — Encounter: Payer: Self-pay | Admitting: Internal Medicine

## 2023-09-30 ENCOUNTER — Other Ambulatory Visit: Payer: Self-pay | Admitting: Internal Medicine

## 2023-10-26 ENCOUNTER — Ambulatory Visit: Payer: No Typology Code available for payment source | Admitting: Internal Medicine

## 2023-10-26 ENCOUNTER — Encounter: Payer: Self-pay | Admitting: Internal Medicine

## 2023-10-26 VITALS — BP 126/82 | HR 53 | Temp 97.8°F | Resp 16 | Ht 71.0 in | Wt 186.1 lb

## 2023-10-26 DIAGNOSIS — R1084 Generalized abdominal pain: Secondary | ICD-10-CM

## 2023-10-26 MED ORDER — DICYCLOMINE HCL 10 MG PO CAPS: 10.0000 mg | ORAL_CAPSULE | Freq: Three times a day (TID) | ORAL | 0 refills | Status: AC

## 2023-10-26 NOTE — Progress Notes (Unsigned)
Subjective:    Patient ID: Daniel Petersen, male    DOB: 1960-09-30, 63 y.o.   MRN: [id number]  DOS:  10/26/2023 Type of visit - description: Acute  Symptoms started about 4 to 5 months ago. Reports daily, essentially stated the pain at the mid abdomen, described as sharp or "hunger pain". He also feels somewhat bloated. The pain is no better or worse with food. His appetite is good. Denies fever or chills. No weight loss. No nausea vomiting. No diarrhea, BMs are daily and normal.  No blood in the stools. Denies any heartburn per se. Does not take NSAIDs regularly.     Wt Readings from Last 3 Encounters:  10/26/23 186 lb 2 oz (84.4 kg)  01/17/23 188 lb (85.3 kg)  01/05/23 188 lb (85.3 kg)     Review of Systems See above   Past Medical History:  Diagnosis Date   Anxiety    BPH (benign prostatic hyperplasia)    Enlarged prostate on rectal examination 2017   GERD (gastroesophageal reflux disease)    Migraine    CURRENTLY ENROLLED IN A STUDY BY THE [facility]   Snoring    s/p 3 sleep study: all neg , last 2011   Stuttering    Urinary urgency 2017    Past Surgical History:  Procedure Laterality Date   CHOLECYSTECTOMY N/A 06/12/2021   Procedure: LAPAROSCOPIC CHOLECYSTECTOMY WITH INTRAOPERATIVE CHOLANGIOGRAM;  Surgeon: Darnell Level, MD;  Location: [facility];  Service: General;  Laterality: N/A;   MASS EXCISION N/A 04/29/2017   Procedure: 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;  Surgeon: Darnell Level, MD;  Location: [facility];  Service: General;  Laterality: N/A;   NASAL SEPTUM SURGERY  2010    Current Outpatient Medications  Medication Instructions   finasteride (PROSCAR) 5 mg, Daily   omeprazole (PRILOSEC) 40 mg, Oral, Daily   sertraline (ZOLOFT) 150 mg, Oral, Daily   tamsulosin (FLOMAX) 0.4 mg, Daily   zolmitriptan (ZOMIG) 5 mg, As needed       Objective:   Physical Exam BP 126/82   Pulse (!) 53   Temp 97.8 F (36.6 C) (Oral)   Resp 16   Ht  5\' 11"  (1.803 m)   Wt 186 lb 2 oz (84.4 kg)   SpO2 97%   BMI 25.96 kg/m  General:   Well developed, NAD, BMI noted.  HEENT:  Normocephalic . Face symmetric, atraumatic.  No jaundice.  No pale. Lungs:  CTA B Normal respiratory effort, no intercostal retractions, no accessory muscle use. Heart: RRR,  no murmur.  Abdomen:  Not distended, soft, minimal tenderness if any at the upper abdomen without mass or rebound. Skin: Not pale. Not jaundice Lower extremities: no pretibial edema bilaterally  Neurologic:  alert & oriented X3.  Speech normal, gait appropriate for age and unassisted Psych--  Cognition and judgment appear intact.  Cooperative with normal attention span and concentration.  Behavior appropriate. No anxious or depressed appearing.     Assessment   Assessment  Anxiety GERD Migraines, f/u neuro stuttering  Snoring --sleep study 3, all  Negative,  last 2011 BPH: LUTS sx after cscope 05-2015 (06-2015: nl DRE, UA, UDX neg, PSA wnl; flomax intolerant); urology eval 2017, had a CT-- "massive gland" Back pain-- seen elsewhere 2016 , had a MRI  PLAN: Abdominal pain: As described above, going on for 4 to 5 months, no fever chills or weight loss.  Abdominal exam is benign. Next colonoscopy due 2026. Etiology is not  completely clear.  H. pylori infection? Plan: CMP, CBC, Hemoccults x 3. Stop PPIs completely for 2 weeks, then, come provide H. pylori testing. Trial with Bentyl, let me know if that helps. Reevaluate in 2 months, if not improving will need further testing. Patient verbalized understanding. Postcholecystectomy diarrhea: Diarrhea was a issue for a while but that is largely resolved.

## 2023-10-26 NOTE — Patient Instructions (Addendum)
Stop omeprazole Do not take any acid reducer Come back to get H. pylori test in 2 weeks.  After you provide the test, you can go back on omeprazole if you like to.  Try a medication called Bentyl to see if that helps reduce the pain  GO TO THE LAB : Get the blood work today.  Next visit with me in 2 months.

## 2023-10-27 ENCOUNTER — Encounter: Payer: Self-pay | Admitting: Internal Medicine

## 2023-10-27 LAB — CBC WITH DIFFERENTIAL/PLATELET
Basophils Absolute: 0.1 10*3/uL (ref 0.0–0.1)
Basophils Relative: 1.2 % (ref 0.0–3.0)
Eosinophils Absolute: 0.3 10*3/uL (ref 0.0–0.7)
Eosinophils Relative: 3.1 % (ref 0.0–5.0)
HCT: 43.7 % (ref 39.0–52.0)
Hemoglobin: 14.9 g/dL (ref 13.0–17.0)
Lymphocytes Relative: 31.5 % (ref 12.0–46.0)
Lymphs Abs: 2.6 10*3/uL (ref 0.7–4.0)
MCHC: 34.2 g/dL (ref 30.0–36.0)
MCV: 91.1 fL (ref 78.0–100.0)
Monocytes Absolute: 0.6 10*3/uL (ref 0.1–1.0)
Monocytes Relative: 7.1 % (ref 3.0–12.0)
Neutro Abs: 4.8 10*3/uL (ref 1.4–7.7)
Neutrophils Relative %: 57.1 % (ref 43.0–77.0)
Platelets: 282 10*3/uL (ref 150.0–400.0)
RBC: 4.79 Mil/uL (ref 4.22–5.81)
RDW: 13.1 % (ref 11.5–15.5)
WBC: 8.3 10*3/uL (ref 4.0–10.5)

## 2023-10-27 LAB — COMPREHENSIVE METABOLIC PANEL
ALT: 15 U/L (ref 0–53)
AST: 15 U/L (ref 0–37)
Albumin: 4.1 g/dL (ref 3.5–5.2)
Alkaline Phosphatase: 73 U/L (ref 39–117)
BUN: 28 mg/dL — ABNORMAL HIGH (ref 6–23)
CO2: 27 meq/L (ref 19–32)
Calcium: 9.1 mg/dL (ref 8.4–10.5)
Chloride: 104 meq/L (ref 96–112)
Creatinine, Ser: 1.21 mg/dL (ref 0.40–1.50)
GFR: 64.33 mL/min (ref >60.00)
Glucose, Bld: 85 mg/dL (ref 70–99)
Potassium: 4.1 meq/L (ref 3.5–5.1)
Sodium: 139 meq/L (ref 135–145)
Total Bilirubin: 0.4 mg/dL (ref 0.2–1.2)
Total Protein: 7.2 g/dL (ref 6.0–8.3)

## 2023-10-27 NOTE — Assessment & Plan Note (Signed)
Abdominal pain: As described above, going on for 4 to 5 months, no fever chills or weight loss.  Abdominal exam is benign. Next colonoscopy due 2026. Etiology is not completely clear.  H. pylori infection? Plan: CMP, CBC, Hemoccults x 3. Stop PPIs completely for 2 weeks, then come back and provide a H. pylori testing. Trial with Bentyl, let me know if that helps. Reevaluate in 2 months, if not improving will need further testing. Patient verbalized understanding. Postcholecystectomy diarrhea: Diarrhea was a issue for a while but that is largely resolved.

## 2023-11-09 ENCOUNTER — Other Ambulatory Visit: Payer: No Typology Code available for payment source

## 2023-12-29 ENCOUNTER — Encounter: Payer: Self-pay | Admitting: Internal Medicine

## 2024-04-01 ENCOUNTER — Other Ambulatory Visit: Payer: Self-pay | Admitting: Internal Medicine

## 2024-04-24 ENCOUNTER — Other Ambulatory Visit: Payer: Self-pay | Admitting: Internal Medicine

## 2024-05-23 ENCOUNTER — Other Ambulatory Visit: Payer: Self-pay | Admitting: Internal Medicine

## 2024-05-23 NOTE — Telephone Encounter (Signed)
 Pt informed of 04/24/24 that he is overdue for appt. Rx denied.   Me to [facility]     04/24/24 11:37 AM Good morning, Per our records you are overdue for an appointment with Dr. Amon, please call the office to schedule an appointment at your earliest convenience.    Thank you, [facility]  Last read by Lorence FORBES Prentiss Ed at 11:37AM on 04/24/2024.

## 2024-05-27 ENCOUNTER — Other Ambulatory Visit: Payer: Self-pay | Admitting: Internal Medicine

## 2024-06-23 ENCOUNTER — Other Ambulatory Visit: Payer: Self-pay | Admitting: Family

## 2024-08-28 ENCOUNTER — Other Ambulatory Visit: Payer: Self-pay | Admitting: Internal Medicine

## 2024-08-28 MED ORDER — SERTRALINE HCL 100 MG PO TABS: 150.0000 mg | ORAL_TABLET | Freq: Every day | ORAL | 0 refills | Status: AC

## 2024-08-28 NOTE — Telephone Encounter (Signed)
 Copied from CRM #[id number]. Topic: Clinical - Medication Question >> Aug 28, 2024  9:23 AM Alexandria E wrote: Reason for CRM: Soonest I could get patient in for a physical is March 10th 2026, patient is needing a refill of his sertraline  (ZOLOFT ) 100 MG tablet, questioning if this medication can be bridge-filled until he gets seen in March.

## 2024-11-20 ENCOUNTER — Encounter: Admitting: Internal Medicine
